# Patient Record
Sex: Male | Born: 1945 | Race: White | Hispanic: No | Marital: Single | Smoking: Current every day smoker
Health system: Southern US, Community
[De-identification: ages and names within clinical notes are randomized; demographics above are authoritative.]

## PROBLEM LIST (undated history)

## (undated) DIAGNOSIS — R519 Headache, unspecified: Secondary | ICD-10-CM

## (undated) DIAGNOSIS — R591 Generalized enlarged lymph nodes: Secondary | ICD-10-CM

## (undated) DIAGNOSIS — G629 Polyneuropathy, unspecified: Secondary | ICD-10-CM

## (undated) DIAGNOSIS — K5903 Drug induced constipation: Secondary | ICD-10-CM

## (undated) DIAGNOSIS — IMO0002 Reserved for concepts with insufficient information to code with codable children: Secondary | ICD-10-CM

## (undated) DIAGNOSIS — D72829 Elevated white blood cell count, unspecified: Secondary | ICD-10-CM

## (undated) DIAGNOSIS — C9110 Chronic lymphocytic leukemia of B-cell type not having achieved remission: Secondary | ICD-10-CM

## (undated) DIAGNOSIS — R51 Headache: Secondary | ICD-10-CM

## (undated) DIAGNOSIS — J189 Pneumonia, unspecified organism: Secondary | ICD-10-CM

## (undated) HISTORY — DX: Reserved for concepts with insufficient information to code with codable children: IMO0002

## (undated) HISTORY — DX: Polyneuropathy, unspecified: G62.9

## (undated) HISTORY — DX: Generalized enlarged lymph nodes: R59.1

## (undated) HISTORY — DX: Chronic lymphocytic leukemia of B-cell type not having achieved remission: C91.10

## (undated) HISTORY — PX: APPENDECTOMY: SHX54

## (undated) HISTORY — PX: SQUAMOUS CELL CARCINOMA EXCISION: SHX2433

## (undated) HISTORY — PX: TONSILLECTOMY: SUR1361

## (undated) HISTORY — DX: Elevated white blood cell count, unspecified: D72.829

## (undated) HISTORY — PX: LYMPH NODE BIOPSY: SHX201

---

## 2011-09-28 ENCOUNTER — Other Ambulatory Visit: Payer: Self-pay | Admitting: Oncology

## 2011-09-28 ENCOUNTER — Other Ambulatory Visit (HOSPITAL_COMMUNITY)
Admission: RE | Admit: 2011-09-28 | Discharge: 2011-09-28 | Disposition: A | Discharge status: Home / Self Care | Payer: Self-pay | Source: Ambulatory Visit | Attending: Oncology | Admitting: Oncology

## 2011-09-28 DIAGNOSIS — D72829 Elevated white blood cell count, unspecified: Secondary | ICD-10-CM | POA: Insufficient documentation

## 2013-11-13 DIAGNOSIS — J189 Pneumonia, unspecified organism: Secondary | ICD-10-CM

## 2013-11-13 HISTORY — DX: Pneumonia, unspecified organism: J18.9

## 2014-08-14 ENCOUNTER — Other Ambulatory Visit (HOSPITAL_COMMUNITY)
Admission: RE | Admit: 2014-08-14 | Discharge: 2014-08-14 | Disposition: A | Discharge status: Home / Self Care | Payer: Medicare Other | Source: Ambulatory Visit | Attending: Oncology | Admitting: Oncology

## 2014-09-09 ENCOUNTER — Other Ambulatory Visit (HOSPITAL_COMMUNITY)
Admission: RE | Admit: 2014-09-09 | Discharge: 2014-09-09 | Disposition: A | Discharge status: Home / Self Care | Payer: Medicare Other | Source: Ambulatory Visit | Attending: Oncology | Admitting: Oncology

## 2014-09-09 DIAGNOSIS — C9110 Chronic lymphocytic leukemia of B-cell type not having achieved remission: Secondary | ICD-10-CM | POA: Diagnosis present

## 2014-09-18 LAB — TISSUE HYBRIDIZATION TO NCBH

## 2014-12-07 ENCOUNTER — Other Ambulatory Visit (HOSPITAL_COMMUNITY): Payer: Self-pay | Admitting: Oncology

## 2014-12-07 DIAGNOSIS — C9110 Chronic lymphocytic leukemia of B-cell type not having achieved remission: Secondary | ICD-10-CM

## 2014-12-07 DIAGNOSIS — G629 Polyneuropathy, unspecified: Secondary | ICD-10-CM

## 2014-12-07 DIAGNOSIS — R599 Enlarged lymph nodes, unspecified: Secondary | ICD-10-CM

## 2014-12-07 DIAGNOSIS — R591 Generalized enlarged lymph nodes: Secondary | ICD-10-CM

## 2014-12-07 DIAGNOSIS — M79601 Pain in right arm: Secondary | ICD-10-CM

## 2014-12-07 DIAGNOSIS — M79621 Pain in right upper arm: Secondary | ICD-10-CM

## 2014-12-17 ENCOUNTER — Telehealth: Payer: Self-pay | Admitting: Neurology

## 2014-12-17 NOTE — Telephone Encounter (Signed)
I moved patient appt off of wait list from 01-14-15 to 12-21-14 notified referring Dr office

## 2014-12-18 ENCOUNTER — Encounter: Payer: Self-pay | Admitting: *Deleted

## 2014-12-21 ENCOUNTER — Encounter: Payer: Self-pay | Admitting: Neurology

## 2014-12-21 ENCOUNTER — Ambulatory Visit (INDEPENDENT_AMBULATORY_CARE_PROVIDER_SITE_OTHER): Payer: Medicare Other | Admitting: Neurology

## 2014-12-21 VITALS — BP 130/70 | HR 96 | Ht 67.0 in | Wt 158.4 lb

## 2014-12-21 DIAGNOSIS — IMO0001 Reserved for inherently not codable concepts without codable children: Secondary | ICD-10-CM

## 2014-12-21 DIAGNOSIS — G8331 Monoplegia, unspecified affecting right dominant side: Secondary | ICD-10-CM

## 2014-12-21 DIAGNOSIS — R202 Paresthesia of skin: Secondary | ICD-10-CM

## 2014-12-21 DIAGNOSIS — R209 Unspecified disturbances of skin sensation: Secondary | ICD-10-CM

## 2014-12-21 DIAGNOSIS — Z720 Tobacco use: Secondary | ICD-10-CM

## 2014-12-21 DIAGNOSIS — F17200 Nicotine dependence, unspecified, uncomplicated: Secondary | ICD-10-CM

## 2014-12-21 DIAGNOSIS — M62541 Muscle wasting and atrophy, not elsewhere classified, right hand: Secondary | ICD-10-CM

## 2014-12-21 DIAGNOSIS — C9110 Chronic lymphocytic leukemia of B-cell type not having achieved remission: Secondary | ICD-10-CM

## 2014-12-21 MED ORDER — NORTRIPTYLINE HCL 10 MG PO CAPS: ORAL_CAPSULE | ORAL | Status: DC

## 2014-12-21 NOTE — Patient Instructions (Signed)
1.  Start nortriptyline 10mg  at bedtime for two weeks, then increase to 2 tablets at bedtime. 2.  I will call you once I have reviewed your MRIs

## 2014-12-21 NOTE — Progress Notes (Signed)
[facility] Clinic Note - Initial Visit   Date: 12/21/2014   Darryl Horton MRN: [id number] DOB: [birth year]   Dear Dr. Bobby Rumpf:   Thank you for your kind referral of Darryl Horton for consultation of right arm pain and paresthesias. Although his history is well known to you, please allow [place] to reiterate it for the purpose of our medical record. The patient was accompanied to the clinic by self.   History of Present Illness: Darryl Horton is a 69 y.o. right-handed Caucasian male with chronic lymphocytic leukemia (diagnosed ~2014 s/p right axillary radiation therapy in summer 2015) and current tobacco use (90+ pack year history) presenting for evaluation of right arm pain, paresthesias, and weakness.    Around 2014, he presented to the hospital after he had an episode of nonsensical speech while talking on the phone.  He thought he had a stroke, so went to the hospital where he was diagnosed with TIA. During that evaluation, he was noted to have elevated white count which ultimately led to the diagnosis of CLL. He underwent imaging which showed enlarged right axillary lymph nodes.  He was being treated at the [place].  About 89-month following the diagnosis of CLL around late Spring 2015, he started developing right armpit discomfort and pain.  He was initially seen by someone filling in for his oncologist who examined his armpit, but did not palpate a mass so continued to observe it.  Over the next few months, his right armpit pain started to become sharp and more intense so he transitioned his care to Dr. Bobby Rumpf in [place].  He ordered needle biopsy of the biceps and axilla which confirmed CLL of his right axillary lymph nodes.  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  He underwent 14 cycles of radiation to his right axilla during the summer of 2015.     Since the summer, he  continued to have slowly progressive paresthesias, pain, and weakness of his right forearm and hand.  He was given narcotics for his pain which eventually alleviated the pain, but he has constant numbness/tingling and weakness of his hand that is unchanged.  He started taking ibruvica in December 2015 and reports having worsening of numbness and tingling.  Numbness involves his right eblow, forearm, and hand.  It is worse with any movement of the arm.  Regarding his weakness, he is unable to to lift anything with his right hand or perform any fine finger movements.  He has difficulty bathing and dressing himself.  He tried doing OT but says that any movement makes the numbness worse, so stopped it.   There is no weakness or paresthesias of left hand or bilateral legs.  He denies any neck pain.  His mood is understandably down and he has no interested.  Denies any suicidal ideation.  He has previously been evaluated by Dr. Metta Clines in Neurology in [place] who performed EMG on his right arm in Jul 2015 which showed right medial antebrachial cutaneous neuropathy.  He was taking neurontin 900mg  TID without any benefit.   He is scheduled to have an MRI cervical and right brachial plexus later this week.   Past Medical History  Diagnosis Date  . Squamous cell carcinoma   . Leukocytosis   . Chronic lymphocytic leukemia   . Neuropathy   . Lymphadenopathy     Past Surgical History  Procedure Laterality Date  .  Squamous cell carcinoma excision    . Lymph node biopsy       Medications:  Ibrutinib 420mg  Gabapentin 900mg  three times daily Vitamin B complex Vitamin C supplement   Allergies: No Known Allergies  Family History: Family History  Problem Relation Age of Onset  . Other Father 45    Deceased, rare blood disorder  . Pancreatic cancer Mother 55    Deceased  . Other Brother     Deceased  . Healthy Son   . Healthy Daughter     Social History: History   Social History  .  Marital Status: Single    Spouse Name: N/A    Number of Children: N/A  . Years of Education: N/A   Occupational History  . Not on file.   Social History Main Topics  . Smoking status: Current Every Day Smoker -- 2.00 packs/day for 45 years    Types: Cigarettes  . Smokeless tobacco: Not on file  . Alcohol Use: No  . Drug Use: No  . Sexual Activity: Not on file   Other Topics Concern  . Not on file   Social History Narrative   Lives with wife.   He runs a Control and instrumentation engineer.   Highest level of education:  B.S.       Review of Systems:  CONSTITUTIONAL: No fevers, chills, night sweats, +40lb weight loss.   EYES: No visual changes or eye pain ENT: No hearing changes.  No history of nose bleeds.   RESPIRATORY: No cough, wheezing and shortness of breath.   CARDIOVASCULAR: Negative for chest pain, and palpitations.   GI: Negative for abdominal discomfort, blood in stools or black stools.  No recent change in bowel habits.   GU:  No history of incontinence.   MUSCLOSKELETAL: No history of joint pain or swelling.  No myalgias.   SKIN: Negative for lesions, rash, and itching.   HEMATOLOGY/ONCOLOGY: Negative for prolonged bleeding, bruising easily, and swollen nodes.  No history of cancer.   ENDOCRINE: Negative for cold or heat intolerance, polydipsia or goiter.   PSYCH:  +depression or anxiety symptoms.   NEURO: As Above.   Vital Signs:  BP 130/70 mmHg  Pulse 96  Ht 5\' 7"  (1.702 m)  Wt 158 lb 7 oz (71.867 kg)  BMI 24.81 kg/m2  SpO2 93%   General Medical Exam:   General:  Well appearing, uncomfortable.   Eyes/ENT: see cranial nerve examination.   Neck: No masses appreciated.  Full range of motion without tenderness.  No carotid bruits. Respiratory:  Clear to auscultation, good air entry bilaterally.   Cardiac:  Regular rate and rhythm, no murmur.   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  Neurological Exam: MENTAL STATUS including orientation to  time, place, person, recent and remote memory, attention span and concentration, language, and fund of knowledge is normal.  Speech is not dysarthric.  CRANIAL NERVES: II:  No visual field defects.  Unremarkable fundi.   III-IV-VI: Pupils equal round and reactive to light.  Normal conjugate, extra-ocular eye movements in all directions of gaze.  No nystagmus.  No ptosis.   V:  Normal facial sensation.  Jaw jerk is absent.   VII:  Normal facial symmetry and movements.  Snout and Myerson's signs are absent.  Bilateral palmomental reflex. VIII:  Normal hearing and vestibular function.   IX-X:  Normal palatal movement.   XI:  Normal shoulder shrug and head rotation.   XII:  Normal tongue strength and range of motion, no deviation or fasciculation.  MOTOR:  Moderate atrophy of the forearm and hands, especially the medial forearm (pronator teres, intrinsic hand muscles).  Generalized loss of muscle bulk throughout.  No pronator drift.    Right Upper Extremity:    Left Upper Extremity:    Deltoid  5/5   Deltoid  5/5   Biceps  3/5   Biceps  5/5   Triceps  5/5   Triceps  5/5   Pronator teres 3/5  Pronator teres 5/5  Infraspinatus 4+/5  Infraspinatus 5/5  Medial pectoralis 5-/5  Medial pectoralis 5/5  Wrist extensors  5-/5   Wrist extensors  5/5   Wrist flexors  3/5   Wrist flexors  5/5   Finger extensors  4/5   Finger extensors  5/5   Finger flexors  4/5   Finger flexors  5/5   Dorsal interossei  4/5   Dorsal interossei  5/5   Abductor pollicis  4/5   Abductor pollicis  5/5   Tone (Ashworth scale)  0  Tone (Ashworth scale)  0   Right Lower Extremity:    Left Lower Extremity:    Hip flexors  5/5   Hip flexors  5/5   Hip extensors  5/5   Hip extensors  5/5   Knee flexors  5/5   Knee flexors  5/5   Knee extensors  5/5   Knee extensors  5/5   Dorsiflexors  5/5   Dorsiflexors  5/5   Plantarflexors  5/5   Plantarflexors  5/5   Toe extensors  5/5   Toe extensors  5/5   Toe flexors  5/5   Toe  flexors  5/5   Tone (Ashworth scale)  0  Tone (Ashworth scale)  0   MSRs:  Right                                                                 Left brachioradialis 3+  brachioradialis 3+  biceps 2+  biceps 3+  triceps 3+  triceps 3+  patellar 3+  patellar 3+  ankle jerk 3+  ankle jerk 3+  Hoffman no  Hoffman no  plantar response up  plantar response down   SENSORY: Reduced pin prick over the right forearm and hand following a circumferential pattern, most notably in the medial forearm. Sensation of the right upper arm appears to be intact. Vibration is reduced at the great toe bilaterally.  Romberg's sign absent.   COORDINATION/GAIT: Normal finger-to- nose-finger.  Intact rapid alternating movements on the left and there is imprecision on the right due to weakness.  Able to rise from a chair without using arms.  Gait narrow based and stable. Tandem and stressed gait intact.    IMPRESSION/PLAN: Darryl Horton is a 69 year-old gentleman with chronic lymphocytic leukemia s/p radiation therapy to right axilla in 2015  presenting for evaluation of progressively worsening right arm weakness, pain, and paresthesias. His neurological examination shows muscle atrophy and weakness involving C5-C8 myotomes with sensory loss involving the entire forearm and hand. Additionally, there is generalized hyperreflexia throughout and an extensor plantar response on the right. Possibilities include right brachial plexopathy (localized infiltration from malignancy versus radiation-induced) or multilevel intraspinal canal lesion affecting  the spinal cord at the cervical level.  With his history of radiation to the axilla, radiation-induced plexopathy needs to be considered, however pain is generally not as severe. I agree with imaging both the right brachial plexus and cervical spine which is scheduled for later this week. If his imaging does not show encroachment of the brachial plexus or cervical nerve roots due to  localized invasion from his lymphadenopathy, the next step will be to obtain EMG to look for myokymia discharges which would support the diagnosis of radiation-induced plexopathy.   From a symptomatic standpoint, he is mostly debilitated by his hand weakness and numbness. I explained that medications for nerve-related pain are more effective for painful paresthesias and less effective for numbness. He has previously tried gabapentin 900 mg 3 times daily without any benefit. I can try him on nortriptyline 10mg  x2 weeks, then increase to 2 tab at bedtime.  Common side effects and precautions were discussed, including suicidal ideation.  We also discussed occupational therapy for his hand weakness however he would like to hold off for now until workup is complete.   Tobacco cessation was discussed, but the patient is not interested in quitting as this is his only enjoyable activity.  The duration of this appointment visit was 60 minutes of face-to-face time with the patient.  Greater than 50% of this time was spent in counseling, explanation of diagnosis, planning of further management, and coordination of care.   Thank you for allowing me to participate in patient's care.  If I can answer any additional questions, I would be pleased to do so.    Sincerely,    Avinash Maltos K. Posey Pronto, DO

## 2014-12-22 NOTE — Progress Notes (Signed)
Note faxed.

## 2014-12-23 ENCOUNTER — Encounter (HOSPITAL_COMMUNITY): Payer: Self-pay | Admitting: *Deleted

## 2014-12-24 ENCOUNTER — Ambulatory Visit (HOSPITAL_COMMUNITY): Payer: Medicare Other | Admitting: Anesthesiology

## 2014-12-24 ENCOUNTER — Ambulatory Visit (HOSPITAL_COMMUNITY)
Admission: RE | Admit: 2014-12-24 | Discharge: 2014-12-24 | Disposition: A | Discharge status: Home / Self Care | Payer: Medicare Other | Source: Ambulatory Visit | Attending: Oncology | Admitting: Oncology

## 2014-12-24 ENCOUNTER — Encounter (HOSPITAL_COMMUNITY)
Admission: RE | Disposition: A | Discharge status: Home / Self Care | Payer: Self-pay | Source: Ambulatory Visit | Attending: Oncology

## 2014-12-24 ENCOUNTER — Encounter (HOSPITAL_COMMUNITY): Payer: Self-pay | Admitting: Anesthesiology

## 2014-12-24 DIAGNOSIS — F17210 Nicotine dependence, cigarettes, uncomplicated: Secondary | ICD-10-CM | POA: Insufficient documentation

## 2014-12-24 DIAGNOSIS — C9110 Chronic lymphocytic leukemia of B-cell type not having achieved remission: Secondary | ICD-10-CM

## 2014-12-24 DIAGNOSIS — R591 Generalized enlarged lymph nodes: Secondary | ICD-10-CM

## 2014-12-24 DIAGNOSIS — R599 Enlarged lymph nodes, unspecified: Secondary | ICD-10-CM

## 2014-12-24 DIAGNOSIS — M79621 Pain in right upper arm: Secondary | ICD-10-CM

## 2014-12-24 DIAGNOSIS — G629 Polyneuropathy, unspecified: Secondary | ICD-10-CM

## 2014-12-24 HISTORY — DX: Pneumonia, unspecified organism: J18.9

## 2014-12-24 HISTORY — DX: Headache: R51

## 2014-12-24 HISTORY — DX: Headache, unspecified: R51.9

## 2014-12-24 HISTORY — PX: RADIOLOGY WITH ANESTHESIA: SHX6223

## 2014-12-24 LAB — CBC
HEMATOCRIT: 47.4 % (ref 39.0–52.0)
HEMOGLOBIN: 15.6 g/dL (ref 13.0–17.0)
MCH: 30.2 pg (ref 26.0–34.0)
MCHC: 32.9 g/dL (ref 30.0–36.0)
MCV: 91.7 fL (ref 78.0–100.0)
Platelets: 316 10*3/uL (ref 150–400)
RBC: 5.17 MIL/uL (ref 4.22–5.81)
RDW: 16.4 % — AB (ref 11.5–15.5)
WBC: 196.8 10*3/uL (ref 4.0–10.5)

## 2014-12-24 LAB — BASIC METABOLIC PANEL
Anion gap: 5 (ref 5–15)
BUN: 10 mg/dL (ref 6–23)
CALCIUM: 9.5 mg/dL (ref 8.4–10.5)
CO2: 29 mmol/L (ref 19–32)
Chloride: 99 mmol/L (ref 96–112)
Creatinine, Ser: 0.89 mg/dL (ref 0.50–1.35)
GFR calc Af Amer: >90 mL/min (ref >90)
GFR calc non Af Amer: 87 mL/min — ABNORMAL LOW (ref >90)
GLUCOSE: 85 mg/dL (ref 70–99)
POTASSIUM: 5.1 mmol/L (ref 3.5–5.1)
Sodium: 133 mmol/L — ABNORMAL LOW (ref 135–145)

## 2014-12-24 SURGERY — RADIOLOGY WITH ANESTHESIA
Anesthesia: Monitor Anesthesia Care

## 2014-12-24 MED ORDER — OXYCODONE HCL 5 MG PO TABS: 5.0000 mg | ORAL_TABLET | Freq: Once | ORAL | Status: DC | PRN

## 2014-12-24 MED ORDER — ONDANSETRON HCL 4 MG/2ML IJ SOLN
4.0000 mg | Freq: Four times a day (QID) | INTRAMUSCULAR | Status: DC | PRN
Start: 2014-12-24 — End: 2014-12-24

## 2014-12-24 MED ORDER — SODIUM CHLORIDE 0.9 % IV SOLN
INTRAVENOUS | Status: DC | PRN
  Administered 2014-12-24: 10:00:00 via INTRAVENOUS

## 2014-12-24 MED ORDER — OXYCODONE HCL 5 MG/5ML PO SOLN: 5.0000 mg | Freq: Once | ORAL | Status: DC | PRN

## 2014-12-24 MED ORDER — LACTATED RINGERS IV SOLN
INTRAVENOUS | Status: DC | PRN
  Administered 2014-12-24: 07:00:00 via INTRAVENOUS

## 2014-12-24 MED ORDER — LACTATED RINGERS IV SOLN
INTRAVENOUS | Status: DC
  Administered 2014-12-24: 07:00:00 via INTRAVENOUS

## 2014-12-24 MED ORDER — FENTANYL CITRATE 0.05 MG/ML IJ SOLN: 25.0000 ug | INTRAMUSCULAR | Status: DC | PRN

## 2014-12-24 NOTE — Discharge Instructions (Signed)
°What to eat: ° °For your first meals, you should eat lightly; only small meals initially.  If you do not have nausea, you may eat larger meals.  Avoid spicy, greasy and heavy food.   ° °General Anesthesia, Adult, Care After  °Refer to this sheet in the next few weeks. These instructions provide you with information on caring for yourself after your procedure. Your health care provider may also give you more specific instructions. Your treatment has been planned according to current medical practices, but problems sometimes occur. Call your health care provider if you have any problems or questions after your procedure.  °WHAT TO EXPECT AFTER THE PROCEDURE  °After the procedure, it is typical to experience:  °Sleepiness.  °Nausea and vomiting. °HOME CARE INSTRUCTIONS  °For the first 24 hours after general anesthesia:  °Have a responsible person with you.  °Do not drive a car. If you are alone, do not take public transportation.  °Do not drink alcohol.  °Do not take medicine that has not been prescribed by your health care provider.  °Do not sign important papers or make important decisions.  °You may resume a normal diet and activities as directed by your health care provider.  °Change bandages (dressings) as directed.  °If you have questions or problems that seem related to general anesthesia, call the hospital and ask for the anesthetist or anesthesiologist on call. °SEEK MEDICAL CARE IF:  °You have nausea and vomiting that continue the day after anesthesia.  °You develop a rash. °SEEK IMMEDIATE MEDICAL CARE IF:  °You have difficulty breathing.  °You have chest pain.  °You have any allergic problems. °Document Released: 02/05/2001 Document Revised: 07/02/2013 Document Reviewed: 05/15/2013  °ExitCare® Patient Information ©2014 [employer].  ° °Sore Throat  ° ° °A sore throat is a painful, burning, sore, or scratchy feeling of the throat. There may be pain or tenderness when swallowing or talking. You may have  other symptoms with a sore throat. These include coughing, sneezing, fever, or a swollen neck. A sore throat is often the first sign of another sickness. These sicknesses may include a cold, flu, strep throat, or an infection called mono. Most sore throats go away without medical treatment.  °HOME CARE  °Only take medicine as told by your doctor.  °Drink enough fluids to keep your pee (urine) clear or pale yellow.  °Rest as needed.  °Try using throat sprays, lozenges, or suck on hard candy (if older than 4 years or as told).  °Sip warm liquids, such as broth, herbal tea, or warm water with honey. Try sucking on frozen ice pops or drinking cold liquids.  °Rinse the mouth (gargle) with salt water. Mix 1 teaspoon salt with 8 ounces of water.  °Do not smoke. Avoid being around others when they are smoking.  °Put a humidifier in your bedroom at night to moisten the air. You can also turn on a hot shower and sit in the bathroom for 5-10 minutes. Be sure the bathroom door is closed. °GET HELP RIGHT AWAY IF:  °You have trouble breathing.  °You cannot swallow fluids, soft foods, or your spit (saliva).  °You have more puffiness (swelling) in the throat.  °Your sore throat does not get better in 7 days.  °You feel sick to your stomach (nauseous) and throw up (vomit).  °You have a fever or lasting symptoms for more than 2-3 days.  °You have a fever and your symptoms suddenly get worse. °MAKE SURE YOU:  °Understand these   instructions.  °Will watch your condition.  °Will get help right away if you are not doing well or get worse. °Document Released: 08/08/2008 Document Revised: 07/24/2012 Document Reviewed: 07/07/2012  °ExitCare® Patient Information ©2015 [employer]. This information is not intended to replace advice given to you by your health care provider. Make sure you discuss any questions you have with your health care provider.  ° ° ° °

## 2014-12-24 NOTE — Anesthesia Postprocedure Evaluation (Signed)
  Anesthesia Post-op Note  Patient: Darryl Horton  Procedure(s) Performed: Procedure(s) with comments: MRI CERVICAL SPINE WITH/WITHOUT CONTRAST/MRI CHEST WITH/WITHOUT CONTRAST (N/A) - MRI/DR. DEQUINCY A. LEWIS  Patient Location: PACU  Anesthesia Type:General  Level of Consciousness: awake, alert , oriented and patient cooperative  Airway and Oxygen Therapy: Patient Spontanous Breathing and Patient connected to nasal cannula oxygen  Post-op Pain: none  Post-op Assessment: Post-op Vital signs reviewed  Post-op Vital Signs: Reviewed and stable  Last Vitals:  Filed Vitals:   12/24/14 1111  BP:   Pulse: 87  Temp:   Resp: 16    Complications: No apparent anesthesia complications

## 2014-12-24 NOTE — Anesthesia Preprocedure Evaluation (Signed)
Anesthesia Evaluation  Patient identified by MRN, date of birth, ID band Patient awake    Reviewed: Allergy & Precautions, NPO status , Patient's Chart, lab work & pertinent test results  Airway Mallampati: II   Neck ROM: full    Dental   Pulmonary Current Smoker,          Cardiovascular negative cardio ROS      Neuro/Psych  Headaches,    GI/Hepatic   Endo/Other    Renal/GU      Musculoskeletal   Abdominal   Peds  Hematology   Anesthesia Other Findings   Reproductive/Obstetrics                             Anesthesia Physical Anesthesia Plan  ASA: II  Anesthesia Plan: MAC   Post-op Pain Management:    Induction: Intravenous  Airway Management Planned: Simple Face Mask  Additional Equipment:   Intra-op Plan:   Post-operative Plan:   Informed Consent: I have reviewed the patients History and Physical, chart, labs and discussed the procedure including the risks, benefits and alternatives for the proposed anesthesia with the patient or authorized representative who has indicated his/her understanding and acceptance.     Plan Discussed with: CRNA, Anesthesiologist and Surgeon  Anesthesia Plan Comments:         Anesthesia Quick Evaluation

## 2014-12-24 NOTE — Transfer of Care (Signed)
Immediate Anesthesia Transfer of Care Note  Patient: Darryl Horton  Procedure(s) Performed: Procedure(s) with comments: MRI CERVICAL SPINE WITH/WITHOUT CONTRAST/MRI CHEST WITH/WITHOUT CONTRAST (N/A) - MRI/DR. DEQUINCY A. LEWIS  Patient Location: PACU  Anesthesia Type:General  Level of Consciousness: awake, alert , oriented and patient cooperative  Airway & Oxygen Therapy: Patient Spontanous Breathing and Patient connected to nasal cannula oxygen  Post-op Assessment: Report given to RN, Post -op Vital signs reviewed and stable and Patient moving all extremities  Post vital signs: Reviewed and stable  Last Vitals:  Filed Vitals:   12/24/14 1111  BP:   Pulse: 87  Temp:   Resp: 16    Complications: No apparent anesthesia complications

## 2014-12-24 NOTE — Progress Notes (Signed)
Dr. Albertha Ghee notified of patient's WBC 196.8.  He repeated it back and stated they will look into it.

## 2014-12-25 ENCOUNTER — Encounter (HOSPITAL_COMMUNITY): Payer: Self-pay | Admitting: Radiology

## 2014-12-30 ENCOUNTER — Telehealth: Payer: Self-pay | Admitting: Neurology

## 2014-12-30 NOTE — Telephone Encounter (Signed)
Pt called stating that he is returning your call from today. C/b [phone]

## 2014-12-30 NOTE — Telephone Encounter (Signed)
I attempted to contact patient via phone today regarding the results of MRI cervical spine and brachial plexus, however there was no answer so a message was left for the patient to return my call.   Donika K. Posey Pronto, DO

## 2015-01-01 NOTE — Telephone Encounter (Signed)
Returned call to patient. Patient saw his oncologist yesterday who was able to schedule EMG for next week.  Imaging is suggestive of a right axillary mass causing localized compression of the neurovascular structures, including distal brachial plexus cord.  Biopsy of this lesion would be definitive, if possible.  Donika K. Posey Pronto, DO

## 2015-01-14 ENCOUNTER — Ambulatory Visit: Payer: Medicare Other | Admitting: Neurology

## 2015-04-18 ENCOUNTER — Emergency Department (HOSPITAL_COMMUNITY)
Admission: EM | Admit: 2015-04-18 | Discharge: 2015-04-19 | Disposition: A | Discharge status: Home / Self Care | Payer: Medicare Other | Attending: Emergency Medicine | Admitting: Emergency Medicine

## 2015-04-18 ENCOUNTER — Encounter (HOSPITAL_COMMUNITY): Payer: Self-pay | Admitting: Emergency Medicine

## 2015-04-18 DIAGNOSIS — Z862 Personal history of diseases of the blood and blood-forming organs and certain disorders involving the immune mechanism: Secondary | ICD-10-CM | POA: Diagnosis not present

## 2015-04-18 DIAGNOSIS — Z8669 Personal history of other diseases of the nervous system and sense organs: Secondary | ICD-10-CM | POA: Insufficient documentation

## 2015-04-18 DIAGNOSIS — Z856 Personal history of leukemia: Secondary | ICD-10-CM | POA: Insufficient documentation

## 2015-04-18 DIAGNOSIS — Z85828 Personal history of other malignant neoplasm of skin: Secondary | ICD-10-CM | POA: Insufficient documentation

## 2015-04-18 DIAGNOSIS — Z79899 Other long term (current) drug therapy: Secondary | ICD-10-CM | POA: Insufficient documentation

## 2015-04-18 DIAGNOSIS — Z8701 Personal history of pneumonia (recurrent): Secondary | ICD-10-CM | POA: Diagnosis not present

## 2015-04-18 DIAGNOSIS — I890 Lymphedema, not elsewhere classified: Secondary | ICD-10-CM | POA: Insufficient documentation

## 2015-04-18 DIAGNOSIS — R590 Localized enlarged lymph nodes: Secondary | ICD-10-CM | POA: Diagnosis present

## 2015-04-18 DIAGNOSIS — Z720 Tobacco use: Secondary | ICD-10-CM | POA: Diagnosis not present

## 2015-04-18 LAB — I-STAT CHEM 8, ED
BUN: 10 mg/dL (ref 6–20)
CALCIUM ION: 1.14 mmol/L (ref 1.13–1.30)
Chloride: 101 mmol/L (ref 101–111)
Creatinine, Ser: 0.8 mg/dL (ref 0.61–1.24)
Glucose, Bld: 133 mg/dL — ABNORMAL HIGH (ref 65–99)
HCT: 41 % (ref 39.0–52.0)
Hemoglobin: 13.9 g/dL (ref 13.0–17.0)
Potassium: 3.6 mmol/L (ref 3.5–5.1)
Sodium: 136 mmol/L (ref 135–145)
TCO2: 21 mmol/L (ref 0–100)

## 2015-04-18 NOTE — ED Notes (Signed)
Patient with swelling and pain in right arm.  Patient states that he went to [place], was not able to get to the door when the pharmacy came to deliver his Lyrica.  He has been diagnosed with lymphedema in that arm and the pain is unbearable at this time.  Arm is red, swollen, not hot.  Patient states that it has gotten worse in the last day or two.

## 2015-04-19 ENCOUNTER — Telehealth: Payer: Self-pay | Admitting: Neurology

## 2015-04-19 ENCOUNTER — Emergency Department (HOSPITAL_COMMUNITY): Payer: Medicare Other

## 2015-04-19 DIAGNOSIS — I890 Lymphedema, not elsewhere classified: Secondary | ICD-10-CM | POA: Diagnosis not present

## 2015-04-19 LAB — CBC WITH DIFFERENTIAL/PLATELET
BASOS ABS: 0 10*3/uL (ref 0.0–0.1)
Basophils Relative: 0 % (ref 0–1)
EOS ABS: 0 10*3/uL (ref 0.0–0.7)
Eosinophils Relative: 0 % (ref 0–5)
HEMATOCRIT: 42.4 % (ref 39.0–52.0)
Hemoglobin: 13.7 g/dL (ref 13.0–17.0)
LYMPHS PCT: 85 % — AB (ref 12–46)
Lymphs Abs: 66.9 10*3/uL — ABNORMAL HIGH (ref 0.7–4.0)
MCH: 29.1 pg (ref 26.0–34.0)
MCHC: 32.3 g/dL (ref 30.0–36.0)
MCV: 90 fL (ref 78.0–100.0)
MONO ABS: 1.6 10*3/uL — AB (ref 0.1–1.0)
Monocytes Relative: 2 % — ABNORMAL LOW (ref 3–12)
NEUTROS ABS: 10.2 10*3/uL — AB (ref 1.7–7.7)
Neutrophils Relative %: 13 % — ABNORMAL LOW (ref 43–77)
PLATELETS: 322 10*3/uL (ref 150–400)
RBC: 4.71 MIL/uL (ref 4.22–5.81)
RDW: 15.6 % — AB (ref 11.5–15.5)
Smear Review: ADEQUATE
WBC: 78.7 10*3/uL (ref 4.0–10.5)

## 2015-04-19 LAB — PATHOLOGIST SMEAR REVIEW

## 2015-04-19 MED ORDER — HYDROMORPHONE HCL 1 MG/ML IJ SOLN
1.0000 mg | INTRAMUSCULAR | Status: DC | PRN
  Administered 2015-04-19 (×2): 1 mg via INTRAVENOUS
  Filled 2015-04-19 (×2): qty 1

## 2015-04-19 MED ORDER — OXYCODONE-ACETAMINOPHEN 5-325 MG PO TABS: 2.0000 | ORAL_TABLET | Freq: Three times a day (TID) | ORAL | Status: DC | PRN

## 2015-04-19 NOTE — Discharge Instructions (Signed)
Lymphedema Darryl Horton, see your primary doctor tomorrow for close follow up.  Take percocet at home as prescribed for worsening pain.  If symptoms worsen, come back to the ED immediately.  Thank you. Lymphedema is a swelling caused by the abnormal collection of lymph under the skin. The lymph is fluid from the tissues in your body that travels in the lymphatic system. This system is part of the immune system that includes lymph nodes and vessels. The lymph vessels collect and carry the excess fluid, fats, proteins, and wastes from the tissues of the body to the bloodstream. This system also works to clean and remove bacteria and waste products from the body.  Lymphedema occurs when the lymphatic system is blocked. When the lymph vessels or lymph nodes are blocked or damaged, lymph does not drain properly. This causes abnormal build up of lymph. This leads to swelling in the arms or legs. Lymphedema cannot be cured by medicines. But the swelling can be reduced by physical methods. CAUSES  There are two types of lymphedema. Primary lymphedema is caused by the absence or abnormality of the lymph vessel at birth. It is also known as inherited lymphedema, which occurs rarely. Secondary or acquired lymphedema occurs when the lymph vessel is damaged or blocked. The causes of lymph vessel blockage are:   Skin infection like cellulites.  Infection by parasites (filariasis).  Injury.  Cancer.  Radiation therapy.  Formation of scar tissue.  Surgery. SYMPTOMS  The symptoms of lymphedema are:  Abnormal swelling of the arm or leg.  Heavy or tight feeling in your arm or leg.  Tight-fitting shoes or rings.  Redness of skin over the affected area.  Limited movement of the affected limb.  Some patients complain about sensitivity to touch and discomfort in the limb(s) affected. You may not have these symptoms immediately following injury. They usually appear within a few days or even years after  injury. Inform your caregiver, if you have any of these symptoms. Early treatment can avoid further problems.  DIAGNOSIS  First, your caregiver will inquire about any surgery you have had or medicines you are taking. He will then examine you. Your caregiver may order special imaging tests, such as:  Lymphoscintigraphy (a test in which a low dose of radioactive substance is injected to trace the flow of lymph through the lymph vessels).  MRI (imaging tests using magnetic fields).  Computed tomography (test using special cross-sectional X-rays).  Duplex ultrasound (test using high-frequency sound waves to show the vessels and the blood flow on a screen).  Lymphangiography (special X-ray taken after injecting a contrast dye into the lymph vessel). It is now rarely done. TREATMENT  Lymphedema can be treated in different ways. Your caregiver will decide the type of treatment depending on the cause. Treatment may include:  Exercise: Special exercises will help fluid move out easily from the affected part. This should be done as per your caregiver's advice.  Manual lymph drainage: Gentle massage of the affected limb makes the fluid to move out more freely.  Compression: Compression stockings or external pump apply pressure over the affected limb. This helps the fluid to move out from the arm or leg. Bandaging can also help to move the fluid out from the affected part. Your caregiver will decide the method that suits you the best.  Medicines: Your caregiver may prescribe antibiotics, if you have infection.  Surgery: Your caregiver may advise surgery for severe lymphedema. It is reserved for special cases when the  patient has difficulty moving. Your surgeon may remove excess tissue from the arm or leg. This will help to ease your movement. Physical therapy may have to be continued after surgery. HOME CARE INSTRUCTIONS  The area is very fragile and is predisposed to injury and infection.  Eat a  healthy diet.  Exercise regularly as per advice.  Keep the affected area clean and dry.  Use gloves while cooking or gardening.  Protect your skin from cuts.  Use electric razor to shave the affected area.  Keep affected limb elevated.  Do not wear tight clothes, shoes, or jewelry as it may cause the tissue to be strangled.  Do not use heat pads over the affected area.  Do not sit with cross legs.  Do not walk barefoot.  Do not carry weight on the affected arm.  Avoid having blood pressure checked on the affected limb. SEEK MEDICAL CARE IF:  You continue to have swelling in your limb. SEEK IMMEDIATE MEDICAL CARE IF:   You have high fever.  You have skin rash.  You have chills or sweats.  You have pain or redness.  You have a cut that does not heal. MAKE SURE YOU:   Understand these instructions.  Will watch your condition.  Will get help right away if you are not doing well or get worse. Document Released: 08/27/2007 Document Revised: 10/16/2012 Document Reviewed: 08/02/2009 [facility] Patient Information 2015 [place], [place]. This information is not intended to replace advice given to you by your health care provider. Make sure you discuss any questions you have with your health care provider.

## 2015-04-19 NOTE — Telephone Encounter (Signed)
Pt wife Carla Drape called and wants to talk to someone about the pain her husband is in please call [phone]

## 2015-04-19 NOTE — ED Notes (Addendum)
Pt arrives with ongoing pain with lymphadenopathy pain since yesterday, states he is supposed to start lyrica but hasn't gotten the RX yet - wasn't able to get to the door when delivered. Usually takes morphine for pain - is out of meds. States seen by oncologist on Wednesday, who wanted him to get an XRAY of R arm in ED - did not go until today.   Pt also has bilateral ankle swelling 1+ edema. First noticed today.

## 2015-04-19 NOTE — ED Provider Notes (Signed)
CSN: [id number]     Arrival date & time 04/18/15  2220 History   None    This chart was scribed for Everlene Balls, MD by Forrestine Him, ED Scribe. This patient was seen in room A08C/A08C and the patient's care was started 12:46 AM.   Chief Complaint  Patient presents with  . Lymphadenopathy   The history is provided by the patient and the spouse. No language interpreter was used.    HPI Comments: Darryl Horton is a 69 y.o. male with a PMHx of lymphadenopathy and squamous cell carcinoma who presents to the Emergency Department complaining of constant, ongoing, progressively worsening R arm pain x 1 day. No aggravating or alleviating factors at this time. Darryl Horton was seen by his Oncologist on 6/1 who is requesting an X-Ray of the R arm. Wife reports previous history of blood clots to the R arm currently being treated with Lovenox. At this time, pt also reports bilateral ankle swelling onset today. Typically pt takes Morphine daily for pain but is out of this medication. Pt is due to start Lyrica but has not received his prescription yet. Prescription arrived for delivery a few days ago, however, pt was unable to get to the door in time. No recent fever, chills, shortness or breath, chest pain, nausea, or vomiting. No known allergies to medications.  Past Medical History  Diagnosis Date  . Squamous cell carcinoma   . Leukocytosis   . Chronic lymphocytic leukemia   . Neuropathy   . Lymphadenopathy   . Pneumonia   . Headache     cluster headaches years ago   Past Surgical History  Procedure Laterality Date  . Squamous cell carcinoma excision    . Lymph node biopsy    . Tonsillectomy    . Appendectomy    . Radiology with anesthesia N/A 12/24/2014    Procedure: MRI CERVICAL SPINE WITH/WITHOUT CONTRAST/MRI CHEST WITH/WITHOUT CONTRAST;  Surgeon: Medication Radiologist, MD;  Location: [facility];  Service: Radiology;  Laterality: N/A;  MRI/DR. DEQUINCY A. LEWIS   Family History   Problem Relation Age of Onset  . Other Father 63    Deceased, rare blood disorder  . Pancreatic cancer Mother 63    Deceased  . Other Brother     Deceased  . Healthy Son   . Healthy Daughter    History  Substance Use Topics  . Smoking status: Current Every Day Smoker -- 2.00 packs/day for 45 years    Types: Cigarettes  . Smokeless tobacco: Never Used  . Alcohol Use: 0.0 oz/week    0 Standard drinks or equivalent per week     Comment: very rare    Review of Systems  Constitutional: Negative for fever and chills.  Respiratory: Negative for cough and shortness of breath.   Cardiovascular: Positive for leg swelling.  Gastrointestinal: Negative for nausea and vomiting.  Musculoskeletal: Positive for joint swelling and arthralgias.  Skin: Negative for rash.  Neurological: Negative for weakness.  Psychiatric/Behavioral: Negative for confusion.  All other systems reviewed and are negative.     Allergies  Review of patient's allergies indicates no known allergies.  Home Medications   Prior to Admission medications   Medication Sig Start Date End Date Taking? Authorizing Provider  B Complex Vitamins (VITAMIN-B COMPLEX) TABS Take 2 tablets by mouth 2 (two) times daily.     Historical Provider, MD  ibrutinib (IMBRUVICA) 140 MG capsul Take 420 mg by mouth daily.    Historical Provider, MD  nortriptyline (PAMELOR) 10 MG capsule Take 1 tablet at bedtime x 2 weeks, then increase to 2 tablets Patient taking differently: Take 10 mg by mouth at bedtime. Take 1 tablet at bedtime x 2 weeks, then increase to 2 tablets 12/21/14   Donika K Patel, DO  temazepam (RESTORIL) 15 MG capsule Take 15 mg by mouth at bedtime as needed for sleep.    Historical Provider, MD   Triage Vitals: BP 127/62 mmHg  Pulse 71  Temp(Src) 98.2 F (36.8 C) (Oral)  Resp 18  Ht 5\' 7"  (1.702 m)  Wt 160 lb (72.576 kg)  BMI 25.05 kg/m2  SpO2 97%   Physical Exam  Constitutional: He is oriented to person, place,  and time. Vital signs are normal. He appears well-developed and well-nourished.  Non-toxic appearance. He does not appear ill. He appears distressed.  HENT:  Head: Normocephalic and atraumatic.  Nose: Nose normal.  Mouth/Throat: Oropharynx is clear and moist. No oropharyngeal exudate.  Eyes: Conjunctivae and EOM are normal. Pupils are equal, round, and reactive to light. No scleral icterus.  Neck: Normal range of motion. Neck supple. No tracheal deviation, no edema, no erythema and normal range of motion present. No thyroid mass and no thyromegaly present.  Cardiovascular: Normal rate, regular rhythm, S1 normal, S2 normal, normal heart sounds, intact distal pulses and normal pulses.  Exam reveals no gallop and no friction rub.   No murmur heard. Pulses:      Radial pulses are 2+ on the right side, and 2+ on the left side.       Dorsalis pedis pulses are 2+ on the right side, and 2+ on the left side.  Normal pulses noted  Pulmonary/Chest: Effort normal and breath sounds normal. No respiratory distress. He has no wheezes. He has no rhonchi. He has no rales.  Abdominal: Soft. Normal appearance and bowel sounds are normal. He exhibits no distension, no ascites and no mass. There is no hepatosplenomegaly. There is no tenderness. There is no rebound, no guarding and no CVA tenderness.  Musculoskeletal: Normal range of motion. He exhibits edema. He exhibits no tenderness.  R upper extemity lymphedema  2 plus bilateral lower extremity edema  Lymphadenopathy:    He has no cervical adenopathy.  Neurological: He is alert and oriented to person, place, and time. He has normal strength. No cranial nerve deficit or sensory deficit.  Sensation intact   Skin: Skin is warm, dry and intact. No petechiae and no rash noted. He is not diaphoretic. No erythema. No pallor.  Psychiatric: He has a normal mood and affect. His behavior is normal. Judgment normal.  Nursing note and vitals reviewed.   ED Course   Procedures (including critical care time)  DIAGNOSTIC STUDIES: Oxygen Saturation is 97% on RA, adequate by my interpretation.    COORDINATION OF CARE: 12:52 AM- Will order DG forearm R, CBC, and i-stat chem 8.  Will give Dilaudid. Discussed treatment plan with pt at bedside and pt agreed to plan.     Labs Review Labs Reviewed  CBC WITH DIFFERENTIAL/PLATELET - Abnormal; Notable for the following:    WBC 78.7 (*)    RDW 15.6 (*)    Neutrophils Relative % 13 (*)    Lymphocytes Relative 85 (*)    Monocytes Relative 2 (*)    Neutro Abs 10.2 (*)    Lymphs Abs 66.9 (*)    Monocytes Absolute 1.6 (*)    All other components within normal limits  I-STAT CHEM 8, ED -  Abnormal; Notable for the following:    Glucose, Bld 133 (*)    All other components within normal limits    Imaging Review Dg Forearm Right  04/19/2015   CLINICAL DATA:  Lymphadenopathy. Right forearm swelling, pain and redness.  EXAM: RIGHT FOREARM - 2 VIEW  COMPARISON:  None.  FINDINGS: There is diffusely decreased density involving the distal radius and ulna from the metaphysis through the articular surface. Diffusely decreased density of the carpal bones and proximal metacarpals. Proximal radius and ulna are intact. There is no bony destructive change. No acute fracture. Soft tissue edema of the upper extremities noted.  IMPRESSION: Decreased density of the carpal bones and distal radius and ulna. This is nonspecific, however can be seen in the setting of complex regional pain syndrome. Inflammatory arthropathy about the hand and wrist could have a similar appearance.  Diffuse soft tissue edema.   Electronically Signed   By: Jeb Levering M.D.   On: 04/19/2015 01:20     EKG Interpretation None      MDM   Final diagnoses:  None    Patient presents to the ED for worsening lypmhedema and pain in the RUE.  This has progressively gotten worse over the past 2 weeks.  He was going to be placed on lyrica but never got the  medication.  He has known history ofclots and is taking lovenox.  He was given dilaudid in the ED for severe pain.    He received dilaudid x 2 doses with good pain relief.  He states he believes he can go home now with this amount of pain control.  Will send home with percocet as he has no more morphine tablets to take at home.  Tomorrow he will follow up with his oncologist and also pick up his lyrica.  He was advised to take the lyrica as his pain sounds like nerve pain.  He describes burning and a shooting pain throughout his arm.     I personally performed the services described in this documentation, which was scribed in my presence. The recorded information has been reviewed and is accurate.   Everlene Balls, MD 04/19/15 [phone]

## 2015-04-20 NOTE — Telephone Encounter (Signed)
Called Dottie back and got VM.  Left message for her to call me back.

## 2015-04-20 NOTE — Telephone Encounter (Signed)
Please review

## 2015-06-14 ENCOUNTER — Other Ambulatory Visit (HOSPITAL_COMMUNITY): Payer: Self-pay | Admitting: Internal Medicine

## 2015-06-14 DIAGNOSIS — C9110 Chronic lymphocytic leukemia of B-cell type not having achieved remission: Secondary | ICD-10-CM

## 2015-06-16 ENCOUNTER — Encounter: Payer: Self-pay | Admitting: Radiation Oncology

## 2015-06-16 ENCOUNTER — Ambulatory Visit: Payer: Medicare Other

## 2015-06-16 ENCOUNTER — Ambulatory Visit: Admission: RE | Admit: 2015-06-16 | Payer: Medicare Other | Source: Ambulatory Visit | Admitting: Radiation Oncology

## 2015-06-16 ENCOUNTER — Ambulatory Visit
Admission: RE | Admit: 2015-06-16 | Discharge: 2015-06-16 | Disposition: A | Discharge status: Home / Self Care | Payer: Medicare Other | Source: Ambulatory Visit | Attending: Radiation Oncology | Admitting: Radiation Oncology

## 2015-06-16 VITALS — BP 108/64 | HR 74 | Temp 98.1°F | Resp 16 | Ht 67.0 in | Wt 170.0 lb

## 2015-06-16 DIAGNOSIS — Z85828 Personal history of other malignant neoplasm of skin: Secondary | ICD-10-CM | POA: Diagnosis not present

## 2015-06-16 DIAGNOSIS — Z808 Family history of malignant neoplasm of other organs or systems: Secondary | ICD-10-CM | POA: Insufficient documentation

## 2015-06-16 DIAGNOSIS — C9110 Chronic lymphocytic leukemia of B-cell type not having achieved remission: Secondary | ICD-10-CM

## 2015-06-16 DIAGNOSIS — Z510 Encounter for antineoplastic radiation therapy: Secondary | ICD-10-CM | POA: Insufficient documentation

## 2015-06-16 DIAGNOSIS — C9210 Chronic myeloid leukemia, BCR/ABL-positive, not having achieved remission: Secondary | ICD-10-CM

## 2015-06-16 NOTE — Progress Notes (Signed)
See progress note under physician encounter. 

## 2015-06-16 NOTE — Progress Notes (Signed)
Radiation Oncology         [phone] ________________________________  Outpatient Re-Consultation (patient seen recently in our practice by Dr Orlene Erm)  Name: Darryl Horton MRN: [id number]  Date: 06/16/2015  DOB: Mar 30, 1946  YT:KPTW,SFKCLE F, NP  Imagene Riches, NP   REFERRING PHYSICIAN: Imagene Riches, NP  DIAGNOSIS:    ICD-9-CM ICD-10-CM   1. CLL (chronic lymphocytic leukemia) 204.10 C91.10     HISTORY OF PRESENT ILLNESS::Darryl Horton is a 69 y.o. male who was diagnosed with chronic lymphocytic leukemia in November 2012. He presented with elevated WBCs and a painful lump in his right axilla. He completed radiotherapy at [facility] on 05/08/14 with 25.2 Gy at 1.8 Gy/fraction by Dr. Orlene Erm to the right brachial plexus. I personally spoke with Dr. Orlene Erm about this pt. The only pathology report I have access to is from 08/14/14 which showed CLL according to peripheral blood cytometry. On 05/18/15 he underwent a CT of his chest, abdomen, and pelvis that demonstrated a slight increase of size in the right axillary mass compared to 03/18/14. There is encasement of the right axillary artery. No other areas of adenopathy is identified. There were indeterminate pulmonary nodules. The pt saw Dr. Jiles Crocker at the [facility] medical center and the plan at present was to continue ibrutinib and undergo biopsy of the right axilla to determine if his CLL has converted to a different histology such as a higher grade lymphoma, and if so, a different chemotherapy might be considered. Of note, his WBC 06/08/15 was 60.6, hemoglobin 13.8, platelets 319. On 04/22/15, Na 141, K 3.8, BON 8, creatinine 0.75, glucose 76  He reports pain before radiotherapy, but not sure if it improved after radiotherapy. The pt's wife notes that he started having pain and swelling in his right arm in November 2015. He then underwent brachial plexus surgery, in April 2016, to improve his symptoms, but the symptoms became  significantly worse after the surgery. The pt's wife also reports that he had complications, including blood clots during the surgery. He went to the ED on 04/18/15 with extreme right arm swelling and pain.  He is here today for the consideration of palliative radiotherapy to the right brachial plexus nodule. He experiences pain in his right shoulder and arm. He is on long acting MS Contin 45 mg, which was increased from 30 mg 1 week ago. He is also on short acting morphine 15 mg that he can take 1-2 tablets every hour prn. He goes to the [facility]. He saw Dr. Asa Lente last week to receive a pain block injection. It did not relive the pain, so he will get another one this coming Monday. He has lymphedema in the right arm and wears a lymph sleeve. He did massage therapy in the past. Edema in his legs is noted. Reports a poor appetite, dizziness, and constipation. The constipation is managed by OTC medication. Reports his pain is a 4/10 while medicated and a 12/10 when not. Reports no feeling in the right arm and not being able to move the arm. The pt expresses that he is unable to lie down due to his arm pain.  PREVIOUS RADIATION THERAPY: Yes. 25.2 Gy completed on 05/08/14 at 1.8 Gy/fraction by Dr. Orlene Erm to the right axilla  PAST MEDICAL HISTORY:  has a past medical history of Squamous cell carcinoma; Leukocytosis; Chronic lymphocytic leukemia; Neuropathy; Lymphadenopathy; Pneumonia; and Headache.    PAST SURGICAL HISTORY: Past Surgical History  Procedure Laterality Date  .  Squamous cell carcinoma excision    . Lymph node biopsy    . Tonsillectomy    . Appendectomy    . Radiology with anesthesia N/A 12/24/2014    Procedure: MRI CERVICAL SPINE WITH/WITHOUT CONTRAST/MRI CHEST WITH/WITHOUT CONTRAST;  Surgeon: Medication Radiologist, MD;  Location: [place];  Service: Radiology;  Laterality: N/A;  MRI/DR. DEQUINCY A. LEWIS    FAMILY HISTORY: family history includes Healthy in his daughter and son;  Other in his brother; Other (age of onset: 55) in his father; Pancreatic cancer (age of onset: 23) in his mother.  SOCIAL HISTORY:  reports that he has been smoking Cigarettes.  He has a 90 pack-year smoking history. He has never used smokeless tobacco. He reports that he drinks alcohol. He reports that he does not use illicit drugs.  ALLERGIES: Review of patient's allergies indicates no known allergies.  MEDICATIONS:  Current Outpatient Prescriptions  Medication Sig Dispense Refill  . ibrutinib (IMBRUVICA) 140 MG capsul Take 420 mg by mouth daily.    Marland Kitchen morphine (MS CONTIN) 15 MG 12 hr tablet Take 45 mg by mouth every 8 (eight) hours. Take 3 tablets by mouth every 8 hours    . morphine (MSIR) 15 MG tablet Take 15 mg by mouth every 2 (two) hours as needed for severe pain.    . pregabalin (LYRICA) 100 MG capsule Take 100 mg by mouth 2 (two) times daily.    . temazepam (RESTORIL) 15 MG capsule Take 15 mg by mouth at bedtime as needed for sleep.    . B Complex Vitamins (VITAMIN-B COMPLEX) TABS Take 2 tablets by mouth 2 (two) times daily.     Marland Kitchen enoxaparin (LOVENOX) 80 MG/0.8ML injection Inject 80 mg into the skin every 12 (twelve) hours.    . nortriptyline (PAMELOR) 10 MG capsule Take 1 tablet at bedtime x 2 weeks, then increase to 2 tablets (Patient not taking: Reported on 06/16/2015) 60 capsule 5  . oxyCODONE-acetaminophen (PERCOCET/ROXICET) 5-325 MG per tablet Take 2 tablets by mouth every 8 (eight) hours as needed for severe pain. (Patient not taking: Reported on 06/16/2015) 10 tablet 0   No current facility-administered medications for this encounter.    REVIEW OF SYSTEMS:  Notable for that above.   PHYSICAL EXAM:  height is 5\' 7"  (1.702 m) and weight is 170 lb (77.111 kg). His oral temperature is 98.1 F (36.7 C). His blood pressure is 108/64 and his pulse is 74. His respiration is 16 and oxygen saturation is 100%.  Pt presents to the clinic in a wheelchair. General: Alert and oriented, in no  acute distress HEENT: Head is normocephalic. Extraocular movements are intact. Oropharynx is clear. Neck: Neck is supple, no palpable cervical or supraclavicular lymphadenopathy. Heart: Regular in rate and rhythm with no murmurs, rubs, or gallops. Chest: Clear to auscultation bilaterally, with no rhonchi, wheezes, or rales. Abdomen: Soft, nontender, nondistended, with no rigidity or guarding. Extremities: He has diffuse upper extremity swelling and loss of motor function in the right upper extremity. Lymph sleeve is in place on the right upper extremity. No obvious palpable axillary mass. Ankle edema b/l.  He needs to lift his right arm with his left hand. Lymphatics: see Neck Exam. Skin: Ecchymosis on his left upper extremity (pt notes it appeared 3 days ago without incident).  Neurologic: Cranial nerves II through XII are grossly intact. No obvious focalities.   Psychiatric: Judgment and insight are intact. Affect is appropriate.  ECOG = 3  0 - Asymptomatic (Fully active,  able to carry on all predisease activities without restriction)  1 - Symptomatic but completely ambulatory (Restricted in physically strenuous activity but ambulatory and able to carry out work of a light or sedentary nature. For example, light housework, office work)  2 - Symptomatic, <50% in bed during the day (Ambulatory and capable of all self care but unable to carry out any work activities. Up and about more than 50% of waking hours)  3 - Symptomatic, >50% in bed, but not bedbound (Capable of only limited self-care, confined to bed or chair 50% or more of waking hours)  4 - Bedbound (Completely disabled. Cannot carry on any self-care. Totally confined to bed or chair)  5 - Death   Eustace Pen MM, Creech RH, Tormey DC, et al. [phone]). "Toxicity and response criteria of the [facility]". [place] Oncol. 5 (6): 649-55   LABORATORY DATA:  Lab Results  Component Value Date   WBC 78.7* 04/18/2015     HGB 13.9 04/18/2015   HCT 41.0 04/18/2015   MCV 90.0 04/18/2015   PLT 322 04/18/2015   CMP     Component Value Date/Time   NA 136 04/18/2015 2303   K 3.6 04/18/2015 2303   CL 101 04/18/2015 2303   CO2 29 12/24/2014 0622   GLUCOSE 133* 04/18/2015 2303   BUN 10 04/18/2015 2303   CREATININE 0.80 04/18/2015 2303   CALCIUM 9.5 12/24/2014 0622   GFRNONAA 87* 12/24/2014 0622   GFRAA >90 12/24/2014 0622         RADIOGRAPHY: as above    IMPRESSION/PLAN: We had a length discussion today.  After piecing together his history, it seems that after he received radiation a year ago, it has not provided improvement in the pt's symptoms or quality of life. Reirradation is possible, but unlikely to provide meaningful long term symptom management.  It will not improve his right arm plexopathy, as it appears he has permanent nerve damage.  RT could possibly help local pain in the axilla. I think biopsy of the mass is a good idea. If there is a change in the histology, it might be worthwhile to find changes in systemic treatment. However, if he ultimately does not have satisfactory palliation from systemic therapy, or cannot tolerate it well, there is "room" for more radiation dose to the axillary tissues as he has not received a dose close to plexus tolerance. He is concerned his plexopathy was due to RT but I feel it is more likely r/t  tumor itself +/- effects of surgery earlier this year.  He will f/u with me PRN.  I am happy to see him for re consultation at any time.      This document serves as a record of services personally performed by Eppie Gibson, MD. It was created on her behalf by Darcus Austin, a trained medical scribe. The creation of this record is based on the scribe's personal observations and the provider's statements to them. This document has been checked and approved by the attending provider.     __________________________________________   Eppie Gibson, MD

## 2015-06-16 NOTE — Progress Notes (Signed)
Histology and Location of Primary Cancer: CLL diagnosed 09/2011  Location(s) of Symptomatic tumor(s): right axillary lymph node, right arm  Past/Anticipated chemotherapy by medical oncology, if any: currently taking Imbruvica, started 10/2014.[facility] [place] consulting medical oncology.  Patient's main complaints related to symptomatic tumor(s) are: pain of right brachial plexus  Pain on a scale of 0-10 is: 4/10 with medication 12/10 without medication  SAFETY ISSUES:  Prior radiation? Yes - to right axilla at [facility] approximately 1 yr.ago; believes he received 15 treatments  Pacemaker/ICD? no  Possible current pregnancy? no  Is the patient on methotrexate? no  Additional Complaints / other details:  69 year old male. Current everyday smoker.  Reports taking Morphine long acting 45 mg every eight hours for pain. Reports this was increased from 30 mg one week ago. Also, patient reports taking morphine 15 mg immediate release 1-2 tablets every two hours for break through pain. Reports Dr. Asa Lente of [facility] administered a pain block two weeks ago "that didn't work" and is scheduled for a second on Monday. Right arm lymphedema sleeve noted. Patient reports his right arm is numb. Reports he is unable to move his right arm at all. Reports dizziness and decreased appetite. Reports pain 4 on a scale of 0-10 with medication but, 12 with movement and without medication. Edema of bilateral extremities noted. Reports sacral bed sores. Reports he is managing constipation with otc medication.

## 2015-06-17 ENCOUNTER — Telehealth: Payer: Self-pay | Admitting: *Deleted

## 2015-06-17 NOTE — Telephone Encounter (Signed)
On 06-17-15 fax medical records to Dr. Jiles Crocker it was consult note.

## 2015-06-18 ENCOUNTER — Telehealth: Payer: Self-pay | Admitting: Hematology and Oncology

## 2015-06-18 NOTE — Telephone Encounter (Signed)
new patient appt-s/w patient wife Darryl Horton and gave np appt for 08/10 @ 12 w/Dr. Alvy Bimler Referring [facility]  Dx-CLL   Referral information scanned

## 2015-06-21 NOTE — Pre-Procedure Instructions (Signed)
   ROHEN KIMES  06/21/2015     Your procedure is scheduled on : Thursday July 01, 2015 at 8:00 AM.  Report to [facility] Admitting at 6:00 A.M.  Call this number if you have problems the morning of surgery: [phone]    Remember:  Do not eat food or drink liquids after midnight.  Take these medicines the morning of surgery with A SIP OF WATER : Morphine (MS Contin)   Do not wear jewelry.  Do not wear lotions, powders, or cologne.   Men may shave face and neck.  Do not bring valuables to the hospital.  [facility] is not responsible for any belongings or valuables.  Contacts, dentures or bridgework may not be worn into surgery.  Leave your suitcase in the car.  After surgery it may be brought to your room.  For patients admitted to the hospital, discharge time will be determined by your treatment team.  Patients discharged the day of surgery will not be allowed to drive home.   Name and phone number of your driver:    Special instructions:   Please read over the following fact sheets that you were given. Pain Booklet, Coughing and Deep Breathing and Surgical Site Infection Prevention

## 2015-06-22 ENCOUNTER — Encounter (HOSPITAL_COMMUNITY)
Admission: RE | Admit: 2015-06-22 | Discharge: 2015-06-22 | Disposition: A | Discharge status: Home / Self Care | Payer: Non-veteran care | Source: Ambulatory Visit | Attending: Orthopedic Surgery | Admitting: Orthopedic Surgery

## 2015-06-22 ENCOUNTER — Encounter (HOSPITAL_COMMUNITY): Payer: Self-pay

## 2015-06-22 DIAGNOSIS — C9110 Chronic lymphocytic leukemia of B-cell type not having achieved remission: Secondary | ICD-10-CM | POA: Diagnosis not present

## 2015-06-22 DIAGNOSIS — F17200 Nicotine dependence, unspecified, uncomplicated: Secondary | ICD-10-CM | POA: Diagnosis not present

## 2015-06-22 DIAGNOSIS — Z86718 Personal history of other venous thrombosis and embolism: Secondary | ICD-10-CM | POA: Diagnosis not present

## 2015-06-22 DIAGNOSIS — Z01812 Encounter for preprocedural laboratory examination: Secondary | ICD-10-CM | POA: Diagnosis not present

## 2015-06-22 DIAGNOSIS — Z79899 Other long term (current) drug therapy: Secondary | ICD-10-CM | POA: Diagnosis not present

## 2015-06-22 DIAGNOSIS — Z01818 Encounter for other preprocedural examination: Secondary | ICD-10-CM | POA: Insufficient documentation

## 2015-06-22 DIAGNOSIS — G629 Polyneuropathy, unspecified: Secondary | ICD-10-CM | POA: Insufficient documentation

## 2015-06-22 HISTORY — DX: Drug induced constipation: K59.03

## 2015-06-22 LAB — CBC
HCT: 43.8 % (ref 39.0–52.0)
HEMOGLOBIN: 14.2 g/dL (ref 13.0–17.0)
MCH: 28.3 pg (ref 26.0–34.0)
MCHC: 32.4 g/dL (ref 30.0–36.0)
MCV: 87.3 fL (ref 78.0–100.0)
Platelets: 362 10*3/uL (ref 150–400)
RBC: 5.02 MIL/uL (ref 4.22–5.81)
RDW: 14.7 % (ref 11.5–15.5)
WBC: 59.7 10*3/uL (ref 4.0–10.5)

## 2015-06-22 LAB — DIFFERENTIAL
BASOS ABS: 0 10*3/uL (ref 0.0–0.1)
Band Neutrophils: 0 % (ref 0–10)
Basophils Relative: 0 % (ref 0–1)
Blasts: 0 %
EOS ABS: 0 10*3/uL (ref 0.0–0.7)
Eosinophils Relative: 0 % (ref 0–5)
LYMPHS ABS: 50.1 10*3/uL — AB (ref 0.7–4.0)
Lymphocytes Relative: 84 % — ABNORMAL HIGH (ref 12–46)
METAMYELOCYTES PCT: 0 %
Monocytes Absolute: 1.8 10*3/uL — ABNORMAL HIGH (ref 0.1–1.0)
Monocytes Relative: 3 % (ref 3–12)
Myelocytes: 0 %
NEUTROS ABS: 7.8 10*3/uL — AB (ref 1.7–7.7)
NEUTROS PCT: 13 % — AB (ref 43–77)
Promyelocytes Absolute: 0 %
nRBC: 0 /100 WBC

## 2015-06-22 LAB — BASIC METABOLIC PANEL
ANION GAP: 8 (ref 5–15)
BUN: 8 mg/dL (ref 6–20)
CALCIUM: 9.2 mg/dL (ref 8.9–10.3)
CHLORIDE: 98 mmol/L — AB (ref 101–111)
CO2: 26 mmol/L (ref 22–32)
CREATININE: 0.8 mg/dL (ref 0.61–1.24)
GFR calc Af Amer: >60 mL/min (ref >60)
GFR calc non Af Amer: >60 mL/min (ref >60)
Glucose, Bld: 100 mg/dL — ABNORMAL HIGH (ref 65–99)
Potassium: 4.2 mmol/L (ref 3.5–5.1)
Sodium: 132 mmol/L — ABNORMAL LOW (ref 135–145)

## 2015-06-22 NOTE — Progress Notes (Signed)
Patient arrived to PAT via wheelchair with his daughter.   Patient denied having any acute cardiac or pulmonary issues.   Patient stated he had a stress test at [facility], but was unsure as to exactly when it occurred. Patient denied having a cardiac cath or sleep study. Will request records.  Nurse inquired about which physician ordered for him to have a MRI and he stated it was Dr. Jiles Crocker. Will request H&P from Dr. Rico Sheehan at [facility] in [place].

## 2015-06-23 ENCOUNTER — Encounter (INDEPENDENT_AMBULATORY_CARE_PROVIDER_SITE_OTHER): Payer: Self-pay

## 2015-06-23 ENCOUNTER — Encounter: Payer: Self-pay | Admitting: Hematology and Oncology

## 2015-06-23 ENCOUNTER — Ambulatory Visit (HOSPITAL_BASED_OUTPATIENT_CLINIC_OR_DEPARTMENT_OTHER): Payer: Medicare Other | Admitting: Hematology and Oncology

## 2015-06-23 ENCOUNTER — Ambulatory Visit: Payer: Medicare Other

## 2015-06-23 ENCOUNTER — Encounter: Payer: Self-pay | Admitting: *Deleted

## 2015-06-23 VITALS — BP 130/68 | HR 70 | Temp 98.1°F | Resp 18 | Ht 67.0 in | Wt 160.9 lb

## 2015-06-23 DIAGNOSIS — M792 Neuralgia and neuritis, unspecified: Secondary | ICD-10-CM

## 2015-06-23 DIAGNOSIS — C9110 Chronic lymphocytic leukemia of B-cell type not having achieved remission: Secondary | ICD-10-CM

## 2015-06-23 DIAGNOSIS — G5680 Other specified mononeuropathies of unspecified upper limb: Secondary | ICD-10-CM | POA: Insufficient documentation

## 2015-06-23 MED ORDER — DEXAMETHASONE 4 MG PO TABS: 4.0000 mg | ORAL_TABLET | Freq: Every day | ORAL | Status: AC

## 2015-06-23 MED ORDER — CARBAMAZEPINE 200 MG PO TABS: 200.0000 mg | ORAL_TABLET | Freq: Two times a day (BID) | ORAL | Status: AC

## 2015-06-23 NOTE — Progress Notes (Signed)
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 male is here for second opinion. The patient was diagnosed with CLL. I review his records extensively. 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   02/22/2015 Surgery the patient underwent brachial plexus release surgery at [facility]   02/28/2015 Imaging ultrasound venous Doppler elsewhere show right axillary vein thrombosis. He was anticoagulated with Lovenox for 3 months   03/19/2015 Imaging CT scan elsewhere show 5.84.5 cm right axillary mass   05/20/2015 Imaging CT scan elsewhere shows 65.3 cm right axillary mass as well as splenomegaly and bilateral lung nodules   Currently, he has severe, uncontrolled neuropathic pain in the right upper extremity region. It is described as sharp and burning pain intermittently, that comes in waves He is rating his pain currently as 6 out of 10 pain. The patient has a surgical release of brachial plexus and had radiation in the past. He also follows with the pain specialist but still have uncontrolled pain. His right arm is nonfunctional with chronic diffuse edema. The patient is right-handed and he uses left hand for most activities of daily living. The pain is so debilitating  that he is virtually bed bound most of the time. He also have chronic bilateral lower extremity edema. He has approximately 40 pound weight loss over the past year related to loss of appetite related to pain. He just completed a course of anticoagulation therapy for postoperative right upper extremity DVT.   MEDICAL HISTORY:  Past Medical History  Diagnosis Date  . Squamous cell carcinoma   . Leukocytosis   . Chronic lymphocytic leukemia   . Neuropathy   . Lymphadenopathy   . Headache     cluster headaches years ago  . Pneumonia 2015  . Constipation due to pain medication     SURGICAL HISTORY: Past Surgical History  Procedure Laterality Date  . Squamous cell carcinoma excision      Right bicep  . Lymph node biopsy    . Tonsillectomy    . Appendectomy    . Radiology with anesthesia N/A 12/24/2014    Procedure: MRI CERVICAL SPINE WITH/WITHOUT CONTRAST/MRI CHEST WITH/WITHOUT CONTRAST;  Surgeon: Medication Radiologist, MD;  Location: [place];  Service: Radiology;  Laterality: N/A;  MRI/DR. DEQUINCY A. LEWIS    SOCIAL HISTORY: Social History   Social History  . Marital Status: Single    Spouse Name: N/A  . Number of Children: N/A  . Years of Education: N/A   Occupational History  . Not on file.   Social History Main Topics  . Smoking status: Current Every Day Smoker -- 1.00 packs/day for 45 years    Types: Cigarettes  . Smokeless tobacco: Never Used  . Alcohol Use: No     Comment: very rare  . Drug Use: No  . Sexual Activity: No  Other Topics Concern  . Not on file   Social History Narrative   Lives with wife.   He runs a Control and instrumentation engineer.   Highest level of education:  B.S.       FAMILY HISTORY: Family History  Problem Relation Age of Onset  . Other Father 32    Deceased, rare blood disorder  . Pancreatic cancer Mother 67    Deceased  . Other Brother     Deceased  . Healthy Son   . Healthy Daughter     ALLERGIES:  has No Known Allergies.  MEDICATIONS:   Current Outpatient Prescriptions  Medication Sig Dispense Refill  . ibrutinib (IMBRUVICA) 140 MG capsul Take 420 mg by mouth daily.    Marland Kitchen morphine (MS CONTIN) 15 MG 12 hr tablet Take 45 mg by mouth every 8 (eight) hours.    Marland Kitchen morphine (MSIR) 15 MG tablet Take 15 mg by mouth every 2 (two) hours as needed for severe pain.    Marland Kitchen temazepam (RESTORIL) 15 MG capsule Take 15 mg by mouth at bedtime as needed for sleep.    . carbamazepine (TEGRETOL) 200 MG tablet Take 1 tablet (200 mg total) by mouth 2 (two) times daily. 60 tablet 0  . dexamethasone (DECADRON) 4 MG tablet Take 1 tablet (4 mg total) by mouth daily. 30 tablet 0   No current facility-administered medications for this visit.    REVIEW OF SYSTEMS:   Constitutional: Denies fevers, chills or abnormal night sweats Eyes: Denies blurriness of vision, double vision or watery eyes Ears, nose, mouth, throat, and face: Denies mucositis or sore throat Respiratory: Denies cough, dyspnea or wheezes Cardiovascular: Denies palpitation, chest discomfort  Gastrointestinal:  Denies nausea, heartburn or change in bowel habits Skin: Denies abnormal skin rashes Neurological:Denies numbness, tingling or new weaknesses Behavioral/Psych: Mood is stable, no new changes  All other systems were reviewed with the patient and are negative.  PHYSICAL EXAMINATION: ECOG PERFORMANCE STATUS: 2 - Symptomatic, <50% confined to bed  Filed Vitals:   06/23/15 1152  BP: 130/68  Pulse: 70  Temp: 98.1 F (36.7 C)  Resp: 18   Filed Weights   06/23/15 1152  Weight: 160 lb 14.4 oz (72.984 kg)    GENERAL:alert, no distress and comfortable. He is uncomfortable due to pain and sitting on the wheelchair SKIN: skin color, texture, turgor are normal, no rashes or significant lesions EYES: normal, conjunctiva are pink and non-injected, sclera clear OROPHARYNX:no exudate, no erythema and lips, buccal mucosa, and tongue normal  NECK: supple, thyroid normal size,  non-tender, without nodularity LYMPH:  no palpable lymphadenopathy in the cervical, left axillary region or inguinal LUNGS: clear to auscultation and percussion with normal breathing effort HEART: regular rate & rhythm and no murmurs and no lower extremity edema ABDOMEN:abdomen soft, non-tender and normal bowel sounds Musculoskeletal:no cyanosis of digits and no clubbing . He has diffuse right upper extremity edema limiting examination of the right axillary region PSYCH: alert & oriented x 3 with fluent speech NEURO: no focal motor/sensory deficits  LABORATORY DATA:  I have reviewed the data as listed Lab Results  Component Value Date   WBC 59.7* 06/22/2015   HGB 14.2 06/22/2015   HCT 43.8 06/22/2015   MCV 87.3 06/22/2015   PLT 362 06/22/2015    Recent Labs  12/24/14 0622 04/18/15 2303 06/22/15 1542  NA 133* 136 132*  K 5.1 3.6 4.2  CL 99 101 98*  CO2 29  --  26  GLUCOSE 85 133*  100*  BUN 10 10 8   CREATININE 0.89 0.80 0.80  CALCIUM 9.5  --  9.2  GFRNONAA 87*  --  >60  GFRAA >90  --  >60    RADIOGRAPHIC STUDIES: I reviewed all his imaging studies I have personally reviewed the radiological images as listed and agreed with the findings in the report.  ASSESSMENT & PLAN:  CLL (chronic lymphocytic leukemia) The patient is currently undergoing further investigation for planned right axillary mass biopsy, arranged by another oncologist, scheduled for 07/01/2015 to exclude high-grade lymphoma. I agree with the plan of care. In the meantime, he appears to be responding to Ibrutinib with reduction of total circulating white blood cell count even though recent imaging studies showed continued progression of the right axillary mass which I suspect could be of malignant transformation of lymphoma. I recommend he call me once he has results of the biopsy for further discussion about plan of care and possible systemic chemotherapy.  Brachial neuropathic pain The patient has severe  neuropathic pain on the right arm. We discussed various options. He is currently receiving care through a pain clinic. He has tried gabapentin and Lyrica in the past, both were discontinued due to lack of benefit and side effects. I recommend a trial of addition of 4 mg of dexamethasone daily after the biopsy is done. I recommend a trial of carbamazepine. I will see him back in a few weeks for further assessment of pain control.     All questions were answered. The patient knows to call the clinic with any problems, questions or concerns. I spent 40 minutes counseling the patient face to face. The total time spent in the appointment was 60 minutes and more than 50% was on counseling.     [facility], [place], MD 06/23/2015 3:44 PM

## 2015-06-23 NOTE — Progress Notes (Signed)
Checked in new patient with no financial concerns.Gave my card if assistance needed.

## 2015-06-23 NOTE — Assessment & Plan Note (Signed)
The patient is currently undergoing further investigation for planned right axillary mass biopsy, arranged by another oncologist, scheduled for 07/01/2015 to exclude high-grade lymphoma. I agree with the plan of care. In the meantime, he appears to be responding to Ibrutinib with reduction of total circulating white blood cell count even though recent imaging studies showed continued progression of the right axillary mass which I suspect could be of malignant transformation of lymphoma. I recommend he call me once he has results of the biopsy for further discussion about plan of care and possible systemic chemotherapy.

## 2015-06-23 NOTE — Assessment & Plan Note (Addendum)
The patient has severe neuropathic pain on the right arm. We discussed various options. He is currently receiving care through a pain clinic. He has tried gabapentin and Lyrica in the past, both were discontinued due to lack of benefit and side effects. I recommend a trial of addition of 4 mg of dexamethasone daily after the biopsy is done. I recommend a trial of carbamazepine. I will see him back in a few weeks for further assessment of pain control.

## 2015-06-23 NOTE — Progress Notes (Addendum)
Anesthesia Chart Review:  Pt is 68 year old male scheduled for MRI R humerus with anesthesia on 07/01/2015.   PMH includes: CLL, leukocytosis, DVT (RUE 03/2015) squamous cell carcinoma, lymphadenopathy, neuropathy. Current smoker. BMI 25. S/p MRI with anesthesia 12/24/14.  Medications include: tegretol, decadron, ibrutinib, morphine.   There is an H&P from pt's oncologist at the [facility], Dr. Rico Sheehan, dated 06/08/15 on paper chart. There is also an H&P in Epic dated 06/16/15 by Dr. Eppie Gibson with radiation oncology.   Preoperative labs reviewed.  WBC 59.7 is consistent with previous results and CLL.   Echo 06/08/2010: 1. Preserved LV EF 55% 2. Mild TR and pulmonary insufficiency 3. Mild LA enlargement 4. Intraatrial septal aneurysm  If no changes, I anticipate pt can proceed with surgery as scheduled.   Willeen Cass, FNP-BC [facility]/Anesthesiology Phone: [phone] 06/23/2015 1:51 PM

## 2015-06-25 ENCOUNTER — Ambulatory Visit: Payer: Medicare Other

## 2015-06-25 ENCOUNTER — Ambulatory Visit: Payer: Medicare Other | Admitting: Radiation Oncology

## 2015-07-01 ENCOUNTER — Encounter (HOSPITAL_COMMUNITY): Payer: Self-pay | Admitting: *Deleted

## 2015-07-01 ENCOUNTER — Ambulatory Visit (HOSPITAL_COMMUNITY): Payer: Non-veteran care | Admitting: Certified Registered Nurse Anesthetist

## 2015-07-01 ENCOUNTER — Ambulatory Visit (HOSPITAL_COMMUNITY)
Admission: RE | Admit: 2015-07-01 | Discharge: 2015-07-01 | Disposition: A | Discharge status: Home / Self Care | Payer: Medicare Other | Source: Ambulatory Visit | Attending: Internal Medicine | Admitting: Internal Medicine

## 2015-07-01 ENCOUNTER — Encounter (HOSPITAL_COMMUNITY)
Admission: RE | Disposition: A | Discharge status: Home / Self Care | Payer: Medicare Other | Source: Ambulatory Visit | Attending: Orthopedic Surgery

## 2015-07-01 ENCOUNTER — Ambulatory Visit (HOSPITAL_COMMUNITY): Payer: Non-veteran care

## 2015-07-01 ENCOUNTER — Ambulatory Visit (HOSPITAL_COMMUNITY): Payer: Non-veteran care | Admitting: Vascular Surgery

## 2015-07-01 ENCOUNTER — Ambulatory Visit (HOSPITAL_COMMUNITY)
Admission: RE | Admit: 2015-07-01 | Discharge: 2015-07-01 | Disposition: A | Discharge status: Home / Self Care | Payer: Non-veteran care | Source: Ambulatory Visit | Attending: Orthopedic Surgery | Admitting: Orthopedic Surgery

## 2015-07-01 DIAGNOSIS — F17210 Nicotine dependence, cigarettes, uncomplicated: Secondary | ICD-10-CM | POA: Insufficient documentation

## 2015-07-01 DIAGNOSIS — G629 Polyneuropathy, unspecified: Secondary | ICD-10-CM | POA: Diagnosis not present

## 2015-07-01 DIAGNOSIS — Z85828 Personal history of other malignant neoplasm of skin: Secondary | ICD-10-CM | POA: Diagnosis not present

## 2015-07-01 DIAGNOSIS — Z79899 Other long term (current) drug therapy: Secondary | ICD-10-CM | POA: Diagnosis not present

## 2015-07-01 DIAGNOSIS — C9110 Chronic lymphocytic leukemia of B-cell type not having achieved remission: Secondary | ICD-10-CM | POA: Insufficient documentation

## 2015-07-01 DIAGNOSIS — R609 Edema, unspecified: Secondary | ICD-10-CM | POA: Insufficient documentation

## 2015-07-01 DIAGNOSIS — Z923 Personal history of irradiation: Secondary | ICD-10-CM | POA: Diagnosis not present

## 2015-07-01 DIAGNOSIS — C7989 Secondary malignant neoplasm of other specified sites: Secondary | ICD-10-CM | POA: Diagnosis not present

## 2015-07-01 DIAGNOSIS — R2230 Localized swelling, mass and lump, unspecified upper limb: Secondary | ICD-10-CM | POA: Insufficient documentation

## 2015-07-01 DIAGNOSIS — R2231 Localized swelling, mass and lump, right upper limb: Secondary | ICD-10-CM | POA: Diagnosis present

## 2015-07-01 DIAGNOSIS — I890 Lymphedema, not elsewhere classified: Secondary | ICD-10-CM | POA: Diagnosis not present

## 2015-07-01 HISTORY — PX: RADIOLOGY WITH ANESTHESIA: SHX6223

## 2015-07-01 SURGERY — RADIOLOGY WITH ANESTHESIA
Anesthesia: General | Laterality: Right

## 2015-07-01 MED ORDER — ONDANSETRON HCL 4 MG/2ML IJ SOLN
INTRAMUSCULAR | Status: DC | PRN
Start: 2015-07-01 — End: 2015-07-01
  Administered 2015-07-01: 4 mg via INTRAVENOUS

## 2015-07-01 MED ORDER — FENTANYL CITRATE (PF) 100 MCG/2ML IJ SOLN: 25.0000 ug | INTRAMUSCULAR | Status: DC | PRN

## 2015-07-01 MED ORDER — LACTATED RINGERS IV SOLN
Freq: Once | INTRAVENOUS | Status: AC
  Administered 2015-07-01: 11:00:00 via INTRAVENOUS

## 2015-07-01 MED ORDER — NEOSTIGMINE METHYLSULFATE 10 MG/10ML IV SOLN
INTRAVENOUS | Status: DC | PRN
  Administered 2015-07-01: 3 mg via INTRAVENOUS

## 2015-07-01 MED ORDER — LACTATED RINGERS IV SOLN
INTRAVENOUS | Status: DC | PRN
  Administered 2015-07-01: 15:00:00 via INTRAVENOUS

## 2015-07-01 MED ORDER — PROMETHAZINE HCL 25 MG/ML IJ SOLN: 6.2500 mg | INTRAMUSCULAR | Status: DC | PRN

## 2015-07-01 MED ORDER — GLYCOPYRROLATE 0.2 MG/ML IJ SOLN
INTRAMUSCULAR | Status: DC | PRN
  Administered 2015-07-01: 0.4 mg via INTRAVENOUS

## 2015-07-01 MED ORDER — SODIUM CHLORIDE 0.9 % IJ SOLN
INTRAMUSCULAR | Status: DC
Start: 2015-07-01 — End: 2015-07-02
  Filled 2015-07-01: qty 10

## 2015-07-01 MED ORDER — PROPOFOL 10 MG/ML IV BOLUS
INTRAVENOUS | Status: DC | PRN
  Administered 2015-07-01: 50 mg via INTRAVENOUS

## 2015-07-01 MED ORDER — GADOBENATE DIMEGLUMINE 529 MG/ML IV SOLN: 15.0000 mL | Freq: Once | INTRAVENOUS | Status: DC | PRN

## 2015-07-01 MED ORDER — LACTATED RINGERS IV SOLN: Freq: Once | INTRAVENOUS | Status: DC

## 2015-07-01 NOTE — Anesthesia Preprocedure Evaluation (Signed)
Anesthesia Evaluation  Patient identified by MRN, date of birth, ID band Patient awake    Reviewed: Allergy & Precautions, NPO status , Patient's Chart, lab work & pertinent test results  Airway Mallampati: II  TM Distance: >3 FB Neck ROM: Full    Dental   Pulmonary Current Smoker,  breath sounds clear to auscultation        Cardiovascular negative cardio ROS  Rhythm:Regular Rate:Normal     Neuro/Psych Neuropathic pain   Neuromuscular disease    GI/Hepatic negative GI ROS, Neg liver ROS,   Endo/Other  negative endocrine ROS  Renal/GU negative Renal ROS     Musculoskeletal   Abdominal   Peds  Hematology CLL.    Anesthesia Other Findings   Reproductive/Obstetrics                             Anesthesia Physical Anesthesia Plan  ASA: III  Anesthesia Plan: General   Post-op Pain Management:    Induction: Intravenous  Airway Management Planned: LMA  Additional Equipment:   Intra-op Plan:   Post-operative Plan: Extubation in OR  Informed Consent: I have reviewed the patients History and Physical, chart, labs and discussed the procedure including the risks, benefits and alternatives for the proposed anesthesia with the patient or authorized representative who has indicated his/her understanding and acceptance.   Dental advisory given  Plan Discussed with: CRNA  Anesthesia Plan Comments:         Anesthesia Quick Evaluation

## 2015-07-01 NOTE — Progress Notes (Signed)
Patient complaining of pain.  Has taken MS IR 15mg  per Dr. Ola Spurr.  8/10

## 2015-07-01 NOTE — H&P (Signed)
Chief Complaint: Patient was seen in consultation today for right axillary mass at the request of Dr. Jiles Crocker  Referring Physician(s): Dr. Jiles Crocker  History of Present Illness: Darryl Horton is a 68 y.o. 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 The patient is scheduled to undergo a MRI today with general anesthesia and IR received request for axillary mass biopsy during anesthesia time. He denies any chest pain, shortness of breath or palpitations. He denies any active signs of bleeding or excessive bruising. The patient denies any history of sleep apnea or chronic oxygen use.    Past Medical History  Diagnosis Date  . Squamous cell carcinoma   . Leukocytosis   . Chronic lymphocytic leukemia   . Neuropathy   . Lymphadenopathy   . Headache     cluster headaches years ago  . Pneumonia 2015  . Constipation due to pain medication     Past Surgical History  Procedure Laterality Date  . Squamous cell carcinoma excision      Right bicep  . Lymph node biopsy    . Tonsillectomy    . Appendectomy    . Radiology with anesthesia N/A 12/24/2014    Procedure: MRI CERVICAL SPINE WITH/WITHOUT CONTRAST/MRI CHEST WITH/WITHOUT CONTRAST;  Surgeon: Medication Radiologist, MD;  Location: [place];  Service: Radiology;  Laterality: N/A;  MRI/DR. DEQUINCY A. LEWIS    Allergies: Review of patient's allergies indicates no known allergies.  Medications: Prior to Admission medications   Medication Sig Start Date End Date Taking? Authorizing Provider  carbamazepine (TEGRETOL) 200 MG tablet Take 1 tablet (200 mg total) by mouth 2 (two) times daily. 06/23/15  Yes Heath Lark, MD  dexamethasone (DECADRON) 4 MG tablet Take 1 tablet (4 mg total) by mouth daily. 06/23/15  Yes Heath Lark, MD  ibrutinib (IMBRUVICA) 140 MG capsul Take 420 mg by mouth daily.   Yes Historical Provider, MD  morphine (MS CONTIN) 15 MG 12 hr tablet Take 45 mg by mouth every 8 (eight) hours.   Yes  Historical Provider, MD  morphine (MSIR) 15 MG tablet Take 15 mg by mouth every 2 (two) hours as needed for severe pain.   Yes Historical Provider, MD  temazepam (RESTORIL) 15 MG capsule Take 15 mg by mouth at bedtime as needed for sleep.    Historical Provider, MD     Family History  Problem Relation Age of Onset  . Other Father 82    Deceased, rare blood disorder  . Pancreatic cancer Mother 41    Deceased  . Other Brother     Deceased  . Healthy Son   . Healthy Daughter     Social History   Social History  . Marital Status: Single    Spouse Name: N/A  . Number of Children: N/A  . Years of Education: N/A   Social History Main Topics  . Smoking status: Current Every Day Smoker -- 1.00 packs/day for 45 years    Types: Cigarettes  . Smokeless tobacco: Never Used  . Alcohol Use: No     Comment: very rare  . Drug Use: No  . Sexual Activity: No   Other Topics Concern  . None   Social History Narrative   Lives with wife.   He runs a Control and instrumentation engineer.   Highest level of education:  B.S.      Review of Systems: A 12 point ROS discussed and pertinent positives are indicated in the HPI above.  All  other systems are negative.  Review of Systems  Vital Signs: BP 146/62 mmHg  Pulse 60  Temp(Src) 98.3 F (36.8 C) (Oral)  Resp 18  Ht 5\' 7"  (1.702 m)  Wt 160 lb (72.576 kg)  BMI 25.05 kg/m2  SpO2 96%  Physical Exam  Constitutional: He is oriented to person, place, and time. No distress.  HENT:  Head: Normocephalic and atraumatic.  Cardiovascular: Normal rate and regular rhythm.  Exam reveals no gallop and no friction rub.   No murmur heard. Pulmonary/Chest: Effort normal and breath sounds normal. No respiratory distress. He has no wheezes. He has no rales.  Musculoskeletal: He exhibits edema and tenderness.  Neurological: He is alert and oriented to person, place, and time.  Skin: He is not diaphoretic.    Mallampati Score:  MD Evaluation Airway: WNL Heart:  WNL Abdomen: WNL Chest/ Lungs: WNL ASA  Classification: 3 Mallampati/Airway Score: Two  Imaging: No results found.  Labs:  CBC:  Recent Labs  12/24/14 0622 04/18/15 2246 04/18/15 2303 06/22/15 1542  WBC 196.8* 78.7*  --  59.7*  HGB 15.6 13.7 13.9 14.2  HCT 47.4 42.4 41.0 43.8  PLT 316 322  --  362    COAGS: No results for input(s): INR, APTT in the last 8760 hours.  BMP:  Recent Labs  12/24/14 0622 04/18/15 2303 06/22/15 1542  NA 133* 136 132*  K 5.1 3.6 4.2  CL 99 101 98*  CO2 29  --  26  GLUCOSE 85 133* 100*  BUN 10 10 8   CALCIUM 9.5  --  9.2  CREATININE 0.89 0.80 0.80  GFRNONAA 87*  --  >60  GFRAA >90  --  >60    Assessment and Plan: CLL Right axillary mass Request for image guided biopsy The patient has been NPO, no blood thinners taken, labs and vitals have been reviewed. Risks and Benefits discussed with the patient including, but not limited to bleeding, infection, damage to adjacent structures or low yield requiring additional tests. All of the patient's questions were answered, patient is agreeable to proceed. Consent signed and in chart.   Thank you for this interesting consult.  I greatly enjoyed meeting Darryl Horton and look forward to participating in their care.  A copy of this report was sent to the requesting provider on this date.  SignedHedy Jacob 07/01/2015, 10:47 AM   I spent a total of 15 Minutes in face to face in clinical consultation, greater than 50% of which was counseling/coordinating care for right axillary mass.

## 2015-07-01 NOTE — Anesthesia Postprocedure Evaluation (Signed)
  Anesthesia Post-op Note  Patient: Darryl Horton  Procedure(s) Performed: Procedure(s): MRI RIGHT HUMERUS     (RADIOLOGY WITH ANESTHESIA) (Right)  Patient Location: PACU  Anesthesia Type:General  Level of Consciousness: awake and alert   Airway and Oxygen Therapy: Patient Spontanous Breathing  Post-op Pain: mild  Post-op Assessment: Post-op Vital signs reviewed and Patient's Cardiovascular Status Stable              Post-op Vital Signs: Reviewed and stable  Last Vitals:  Filed Vitals:   07/01/15 1630  BP: 144/79  Pulse: 107  Temp:   Resp: 20    Complications: No apparent anesthesia complications

## 2015-07-01 NOTE — Transfer of Care (Signed)
Immediate Anesthesia Transfer of Care Note  Patient: Darryl Horton  Procedure(s) Performed: Procedure(s): MRI RIGHT HUMERUS     (RADIOLOGY WITH ANESTHESIA) (Right)  Patient Location: PACU  Anesthesia Type:General  Level of Consciousness: awake, oriented and patient cooperative  Airway & Oxygen Therapy: Patient Spontanous Breathing and Patient connected to nasal cannula oxygen  Post-op Assessment: Report given to RN, Post -op Vital signs reviewed and stable and Patient moving all extremities  Post vital signs: Reviewed and stable  Last Vitals:  Filed Vitals:   07/01/15 0625  BP: 146/62  Pulse: 60  Temp: 36.8 C  Resp: 18    Complications: No apparent anesthesia complications

## 2015-07-01 NOTE — Anesthesia Procedure Notes (Signed)
Procedure Name: Intubation Date/Time: 07/01/2015 1:47 PM Performed by: Izora Gala Pre-anesthesia Checklist: Suction available, Patient identified, Emergency Drugs available and Patient being monitored Patient Re-evaluated:Patient Re-evaluated prior to inductionOxygen Delivery Method: Circle system utilized Preoxygenation: Pre-oxygenation with 100% oxygen Intubation Type: IV induction Ventilation: Mask ventilation without difficulty Laryngoscope Size: Miller and 2 Grade View: Grade II Tube type: Oral Tube size: 7.5 mm Number of attempts: 1 Placement Confirmation: ETT inserted through vocal cords under direct vision,  positive ETCO2 and breath sounds checked- equal and bilateral Secured at: 23 cm Tube secured with: Tape Dental Injury: Teeth and Oropharynx as per pre-operative assessment

## 2015-07-01 NOTE — Progress Notes (Signed)
Pt c/o severe chronic pain RUE-spouse at bedside with pt's po Morphine. Dr Smith Robert updated-OK to give pt's home dose of 45 mg po Morphine. Med given to pt by wife.

## 2015-07-02 ENCOUNTER — Encounter (HOSPITAL_COMMUNITY): Payer: Self-pay | Admitting: Radiology

## 2015-07-06 ENCOUNTER — Telehealth: Payer: Self-pay | Admitting: *Deleted

## 2015-07-06 NOTE — Telephone Encounter (Signed)
NEED DR.GORSUCH'S OFFICE NOTE. FAXED DR.GORSUCH'S OFFICE NOTE OF 06/23/15 TO FAX # [phone].

## 2015-07-06 NOTE — Telephone Encounter (Signed)
REQUEST FOR MEDICAL RECORDS FAX TO KIM HEGARTY IN HIM.

## 2015-07-09 MED FILL — Lidocaine HCl IV Inj 20 MG/ML: INTRAVENOUS | Qty: 5 | Status: AC

## 2015-07-09 MED FILL — Rocuronium Bromide IV Soln 100 MG/10ML (10 MG/ML): INTRAVENOUS | Qty: 10 | Status: AC

## 2015-07-09 MED FILL — Propofol IV Emul 200 MG/20ML (10 MG/ML): INTRAVENOUS | Qty: 20 | Status: AC

## 2015-07-09 MED FILL — Fentanyl Citrate Preservative Free (PF) Inj 100 MCG/2ML: INTRAMUSCULAR | Qty: 2 | Status: AC

## 2015-07-09 MED FILL — Midazolam HCl Inj 2 MG/2ML (Base Equivalent): INTRAMUSCULAR | Qty: 2 | Status: AC

## 2015-07-12 ENCOUNTER — Encounter: Payer: Self-pay | Admitting: *Deleted

## 2015-07-12 ENCOUNTER — Telehealth: Payer: Self-pay | Admitting: *Deleted

## 2015-07-12 ENCOUNTER — Other Ambulatory Visit: Payer: Self-pay | Admitting: Hematology and Oncology

## 2015-07-12 ENCOUNTER — Telehealth: Payer: Self-pay | Admitting: Hematology and Oncology

## 2015-07-12 NOTE — Telephone Encounter (Signed)
Biopsy results available and Dr. Alvy Bimler s/w pt's wife.

## 2015-07-12 NOTE — Progress Notes (Unsigned)
VM from Maryagnes Amos at [facility] in [place],  Oncology dept.  Requests biopsy results for Dr. Jiles Crocker.  Faxed the results to Dr. Jiles Crocker at fax [phone].   (phone number is [phone], ext [phone].

## 2015-07-12 NOTE — Telephone Encounter (Signed)
I have not heard Can you call his hematologist from [place] to see if they have results yet?

## 2015-07-12 NOTE — Telephone Encounter (Signed)
I reviewed the results with his wife to briefly over the phone. The patient is not doing too well even with the addition of Tegretol & dexamethasone. I will make him an appointment to see me in 2 days time to review test results and treatment recommendation

## 2015-07-12 NOTE — Telephone Encounter (Signed)
Wife asks if Dr. Alvy Bimler has gotten Biopsy results yet?  She says it was done over a week ago and they have not heard anything.

## 2015-07-13 ENCOUNTER — Telehealth: Payer: Self-pay | Admitting: Hematology and Oncology

## 2015-07-13 NOTE — Telephone Encounter (Signed)
Added appt per pof..per pof pt is aware

## 2015-07-14 ENCOUNTER — Encounter: Payer: Self-pay | Admitting: Hematology and Oncology

## 2015-07-14 ENCOUNTER — Ambulatory Visit (HOSPITAL_BASED_OUTPATIENT_CLINIC_OR_DEPARTMENT_OTHER): Payer: Medicare Other | Admitting: Hematology and Oncology

## 2015-07-14 VITALS — BP 123/60 | HR 70 | Temp 97.8°F | Resp 16 | Ht 67.0 in | Wt 163.0 lb

## 2015-07-14 DIAGNOSIS — C9110 Chronic lymphocytic leukemia of B-cell type not having achieved remission: Secondary | ICD-10-CM | POA: Diagnosis present

## 2015-07-14 DIAGNOSIS — G5680 Other specified mononeuropathies of unspecified upper limb: Secondary | ICD-10-CM

## 2015-07-14 DIAGNOSIS — C4492 Squamous cell carcinoma of skin, unspecified: Secondary | ICD-10-CM | POA: Insufficient documentation

## 2015-07-14 MED FILL — Ephedrine Sulf-NaCl Soln Pref Syr 50 MG/10ML-0.9% (5 MG/ML): INTRAVENOUS | Qty: 10 | Status: AC

## 2015-07-14 NOTE — Progress Notes (Signed)
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   02/22/2015 Surgery the patient underwent brachial plexus release surgery at [facility]   02/28/2015 Imaging ultrasound venous Doppler elsewhere show right axillary vein thrombosis. He was anticoagulated with Lovenox for 3 months   03/19/2015 Imaging CT scan elsewhere show 5.84.5 cm right axillary mass   05/20/2015 Imaging CT scan elsewhere shows 65.3 cm right axillary mass as well as splenomegaly and bilateral lung nodules    Squamous cell skin cancer   07/01/2015 Imaging  MRI of the right humerus show a large necrotic mass in the axilla.   07/01/2015 Pathology Results Accession: [id number]  biopsy confirmed metastatic squamous cell carcinoma   07/01/2015 Procedure  he underwent ultrasound-guided core biopsy    INTERVAL HISTORY: Please see below for problem oriented charting. He returns for further follow-up. He is still in debilitating pain and is dependent on his wife for activities of daily living. He also had new onset of sacral decubitus ulcer.  REVIEW OF SYSTEMS:   Constitutional: Denies fevers, chills or abnormal weight loss Eyes: Denies blurriness of vision Ears, nose, mouth, throat, and face: Denies mucositis or sore throat Respiratory: Denies cough, dyspnea or wheezes Cardiovascular: Denies palpitation, chest discomfort or lower extremity swelling Gastrointestinal:  Denies nausea, heartburn or  change in bowel habits Lymphatics: Denies new lymphadenopathy or easy bruising Neurological:Denies numbness, tingling or new weaknesses Behavioral/Psych: Mood is stable, no new changes  All other systems were reviewed with the patient and are negative.  I have reviewed the past medical history, past surgical history, social history and family history with the patient and they are unchanged from previous note.  ALLERGIES:  has No Known Allergies.  MEDICATIONS:  Current Outpatient Prescriptions  Medication Sig Dispense Refill  . carbamazepine (TEGRETOL) 200 MG tablet Take 1 tablet (200 mg total) by mouth 2 (two) times daily. 60 tablet 0  . dexamethasone (DECADRON) 4 MG tablet Take 1 tablet (4 mg total) by mouth daily. 30 tablet 0  . ibrutinib (IMBRUVICA) 140 MG capsul Take 420 mg by mouth daily.    Marland Kitchen morphine (MS CONTIN) 15 MG 12 hr tablet Take 45 mg by mouth every 8 (eight) hours.    Marland Kitchen morphine (MSIR) 15 MG tablet Take 15 mg by mouth every 2 (two) hours as needed for severe pain.    Marland Kitchen temazepam (RESTORIL) 15 MG capsule Take 15 mg by mouth at bedtime as needed for sleep.     No current facility-administered medications for this visit.    PHYSICAL EXAMINATION: ECOG PERFORMANCE STATUS: 2 - Symptomatic, <50% confined to bed  Filed Vitals:   07/14/15 1334  BP: 123/60  Pulse: 70  Temp: 97.8 F (36.6 C)  Resp: 16   Filed Weights   07/14/15 1334  Weight: 163 lb (73.936 kg)    GENERAL:alert, no distress and comfortable. The patient is in obvious pain , sitting on wheelchair SKIN: skin color, texture, turgor are normal, no rashes or significant lesions EYES: normal, Conjunctiva are  pink and non-injected, sclera clear Musculoskeletal:no cyanosis of digits and no clubbing . He has profound right upper extremity edema NEURO: alert & oriented x 3 with fluent speech, no focal motor/sensory deficits  LABORATORY DATA:  I have reviewed the data as listed    Component Value Date/Time    NA 132* 06/22/2015 1542   K 4.2 06/22/2015 1542   CL 98* 06/22/2015 1542   CO2 26 06/22/2015 1542   GLUCOSE 100* 06/22/2015 1542   BUN 8 06/22/2015 1542   CREATININE 0.80 06/22/2015 1542   CALCIUM 9.2 06/22/2015 1542   GFRNONAA >60 06/22/2015 1542   GFRAA >60 06/22/2015 1542    No results found for: SPEP, UPEP  Lab Results  Component Value Date   WBC 59.7* 06/22/2015   NEUTROABS 7.8* 06/22/2015   HGB 14.2 06/22/2015   HCT 43.8 06/22/2015   MCV 87.3 06/22/2015   PLT 362 06/22/2015      Chemistry      Component Value Date/Time   NA 132* 06/22/2015 1542   K 4.2 06/22/2015 1542   CL 98* 06/22/2015 1542   CO2 26 06/22/2015 1542   BUN 8 06/22/2015 1542   CREATININE 0.80 06/22/2015 1542      Component Value Date/Time   CALCIUM 9.2 06/22/2015 1542      ASSESSMENT & PLAN:  Squamous cell skin cancer  Unfortunately, the biopsy come for 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 He had multiple skin cancers removed from the right upper extremity in the past Disease burden is high. He had radiation therapy in the past without success of getting rid of the tumor. I reviewed with him treatment options including cisplatin, carboplatin or cetuximab. His performance status is very poor and he has significant disability related to that. He also has a new sacral decubitus ulcer due to poor mobility. Frankly, I do not believe he would be able to tolerate chemotherapy. At best, he might be able to tolerate cetuximab. However, he would need staging CT scan, chemotherapy education class, port placement  and blood draw prior to start treatment. I told him, at best, cetuximab might be able to retard the growth but without significant shrinkage even if he were to respond to treatment. I recommend him to consider palliative care consult at this point. I also offer him potential treatment option with hospice. The patient will get back to me next week after he discussed with his wife  and family members. He is interested to pursue palliative care consult right now.  Brachial neuropathic pain  He has significant debilitating pain. He has a pain management consult scheduled next week and I recommend he keeps that appointment  CLL (chronic lymphocytic leukemia)  He will continue Ibrutinib for now.   I gave him multiple patient education handout regarding different treatment options No orders of the defined types were placed in this encounter.   All questions were answered. The patient knows to call the clinic with any problems, questions or concerns. No barriers to learning was detected. I spent 30 minutes counseling the patient face to face. The total time spent in the appointment was 40 minutes and more than 50% was on counseling and review of test results     [facility], [place], MD 07/14/2015 4:28 PM

## 2015-07-14 NOTE — Assessment & Plan Note (Signed)
He will continue Ibrutinib for now.

## 2015-07-14 NOTE — Assessment & Plan Note (Signed)
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 He had multiple skin cancers removed from the right upper extremity in the past Disease burden is high. He had radiation therapy in the past without success of getting rid of the tumor. I reviewed with him treatment options including cisplatin, carboplatin or cetuximab. His performance status is very poor and he has significant disability related to that. He also has a new sacral decubitus ulcer due to poor mobility. Frankly, I do not believe he would be able to tolerate chemotherapy. At best, he might be able to tolerate cetuximab. However, he would need staging CT scan, chemotherapy education class, port placement  and blood draw prior to start treatment. I told him, at best, cetuximab might be able to retard the growth but without significant shrinkage even if he were to respond to treatment. I recommend him to consider palliative care consult at this point. I also offer him potential treatment option with hospice. The patient will get back to me next week after he discussed with his wife and family members. He is interested to pursue palliative care consult right now.

## 2015-07-14 NOTE — Assessment & Plan Note (Signed)
He has significant debilitating pain. He has a pain management consult scheduled next week and I recommend he keeps that appointment

## 2015-07-15 ENCOUNTER — Ambulatory Visit: Payer: Medicare Other | Admitting: Hematology and Oncology

## 2015-07-15 ENCOUNTER — Telehealth: Payer: Self-pay | Admitting: *Deleted

## 2015-07-15 ENCOUNTER — Encounter: Payer: Self-pay | Admitting: Hematology and Oncology

## 2015-07-15 NOTE — Telephone Encounter (Signed)
Called [facility] for Palliative Care Consult.  S/w Jocelyn Lamer in Referral Center.

## 2015-07-16 ENCOUNTER — Encounter: Payer: Self-pay | Admitting: Radiation Oncology

## 2015-07-16 NOTE — Progress Notes (Signed)
PHONE NOTE  I received  the biopsy results for Darryl Horton, which revealed squamous cell carcinoma, from Dr. Alvy Bimler, who has been in touch with the patient most recently.  Darryl. Prudy Feeler was curious if there is a possibility of re-treatment with RT.  He is not a good candidate for chemotherapy per Dr Calton Dach notes.  I called the patient and we discussed that retreatment is an option, but that RT is very unlikely to improve his arm function or swelling. It might help his pain/discomfort, but this is questionable considering that any damage to the nerves that is responsible for his pain will not be healed by RT.  The goal of RT would be to control the tumor growth and hopefully improve his discomfort (ie if the tumor regresses somewhat).  He made clear that his goal is quality of life, not longevity. He will discuss this with his wife and call again if interested in treatment. I encouraged him to consider re-consultation with Dr. Orlene Erm if he wants treatment closer  to home (pt lives in [place] and previously was treated there).  Agree with pending palliative care consult.  -----------------------------------  Eppie Gibson, MD

## 2015-07-21 ENCOUNTER — Telehealth: Payer: Self-pay | Admitting: *Deleted

## 2015-07-21 NOTE — Telephone Encounter (Signed)
S/w Billey Chang, NP w/ Palliative Care.   He saw pt this morning.  He reviewed pt's pain management and reinforced his ms contin and ms ir orders.  Pt not taking ms contin as often as ordered and Josh instructed pt/wife to take as ordered for better results.   (Pt usually only taking 2 ms contin TID instead of 3 as ordered).  Josh suggested pt use med box for ms contin and then use the ms ir q 2 hrs prn as ordered. He recommends Dr. Alvy Bimler add Cymbalta or Elavil.   Pt has admitted depression and elements of neuropathic pain.   Josh says pt is seeing Dr. Jiles Crocker tomorrow.  Pt is trying to decide if he wants to take treament, immunotherapy, or be enrolled in Hospice care.   Pt said he would only want to try treatment if it would help alleviate his pain or add to his quality of life.    Pt will discuss w/ Dr. Jiles Crocker tomorrow.  Josh had instructed pt to contact them if he is interested in Hospice.  He also completed a DNR form for pt's home.

## 2015-07-21 NOTE — Telephone Encounter (Signed)
-----   Message from Heath Lark, MD sent at 07/21/2015 10:42 AM EDT ----- Regarding: can you check Can you check if he has made contact with hospice/palliative care?

## 2015-07-21 NOTE — Telephone Encounter (Signed)
Pls call him tomorrow with decision We can add Cymbalta but I prefer to hold off for now due to recent medication changes

## 2015-07-26 ENCOUNTER — Telehealth: Payer: Self-pay | Admitting: *Deleted

## 2015-07-26 NOTE — Telephone Encounter (Signed)
S/w pt's spouse, Dottie.  She says Dr. Jiles Crocker told pt last week she thought immunotherapy would not benefit pt's pain or improve his quality of life very much.  Therefore pt had said he does not want any treatment and will probably enroll with Hospice.  Dr. Jiles Crocker told them she can work w/ [facility] and Hospice for orders if needed.   Wife says they have Josh's number to call him when they are ready.  But wife wants to talk to pt one more time about his wishes before giving Dr. Alvy Bimler a decision.  She asks that nurse call her back tomorrow.

## 2015-07-26 NOTE — Telephone Encounter (Signed)
-----   Message from Heath Lark, MD sent at 07/26/2015 10:31 AM EDT ----- Regarding: decision? Can you call and ask him what is his decision?

## 2015-07-27 ENCOUNTER — Telehealth: Payer: Self-pay | Admitting: *Deleted

## 2015-07-27 NOTE — Telephone Encounter (Signed)
S/w pt and he states he has decided to enroll in Hospice and has already contacted [place] w/ [facility].   Pt will call us if he needs anything.

## 2015-07-27 NOTE — Telephone Encounter (Signed)
-----   Message from Heath Lark, MD sent at 07/26/2015 10:31 AM EDT ----- Regarding: decision? Can you call and ask him what is his decision?

## 2015-12-27 IMAGING — US IR FLUORO GUIDE NDL PLMT / BX
1 series · 11 of 11 positions shown · non-contrast
Comparison: none

CLINICAL DATA: 58-year-old male with a history of chronic
lymphocytic leukemia and perineural tumor surrounding the right
brachial practice and right axillary artery. He has profound chronic
right upper extremity lymphedema and pain. Ultrasound-guided biopsy
is warranted to reveal evaluate for transformation.

EXAM:
IR US GUIDE NEEDLE PLACEMENT /BIOPSY
Date: 07/01/2015
PROCEDURE:
1. Ultrasound guided core biopsy right very neurovascular bundle
mass
ANESTHESIA/SEDATION:
General anesthesia provided by the anesthesiology service
MEDICATIONS:
None additional
TECHNIQUE: Informed consent was obtained from the patient following explanation
of the procedure, risks, benefits and alternatives. The patient
understands, agrees and consents for the procedure. All questions
were addressed. A time out was performed.

[Series 1: ir fluoro guide ndl plmt / bx · 0.08mm/px · 11 acquisitions, 11 frames shown]
[im 1/11]
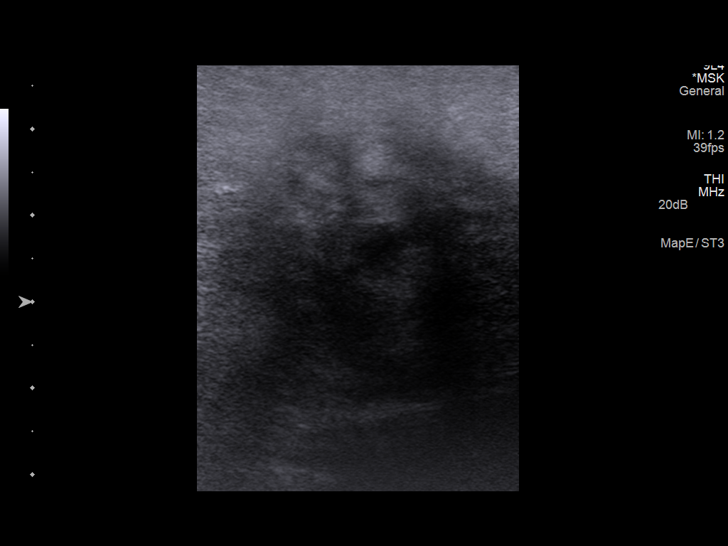
[im 2/11]
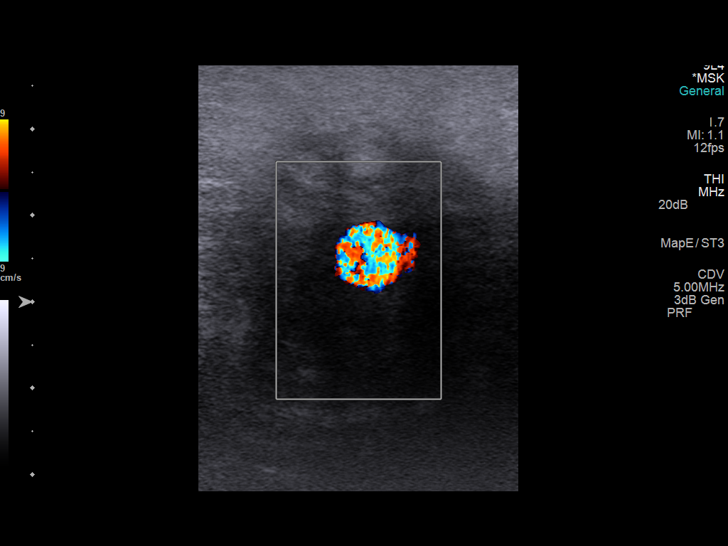
[im 3/11]
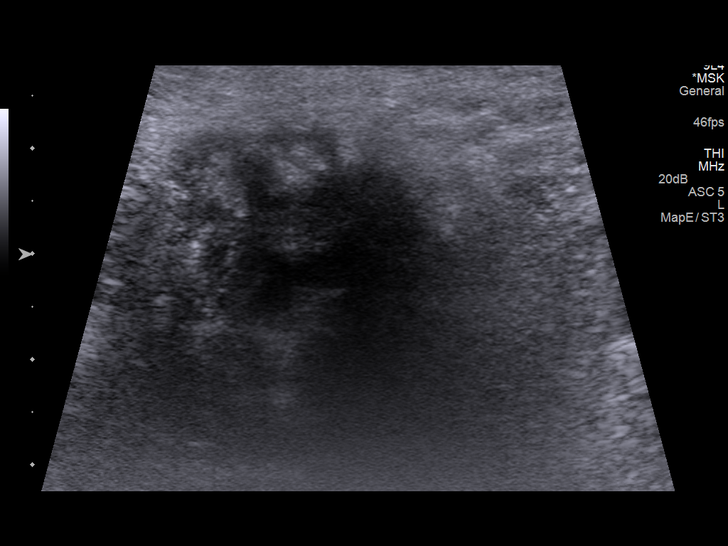
[im 4/11]
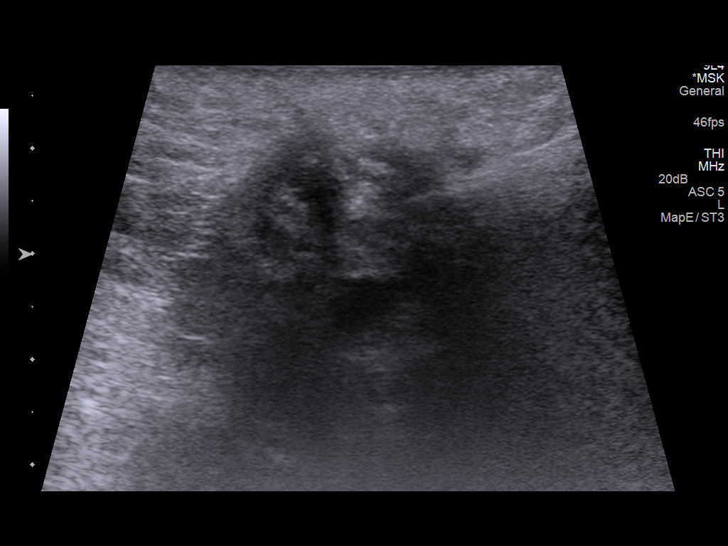
[im 5/11]
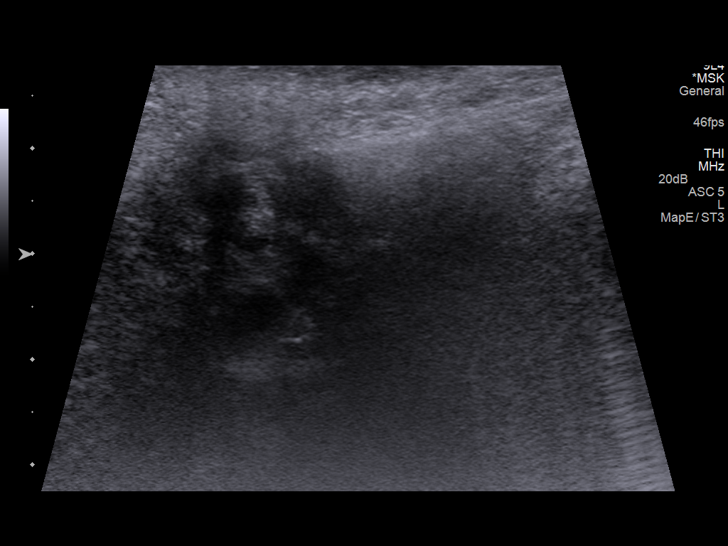
[im 6/11]
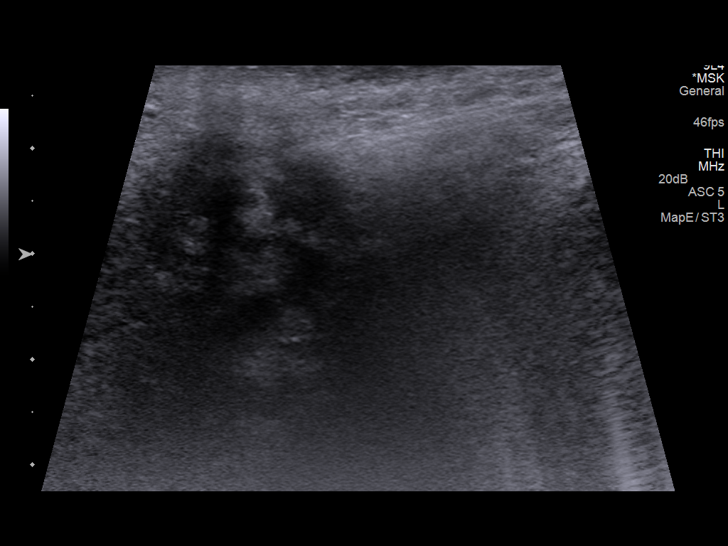
[im 7/11]
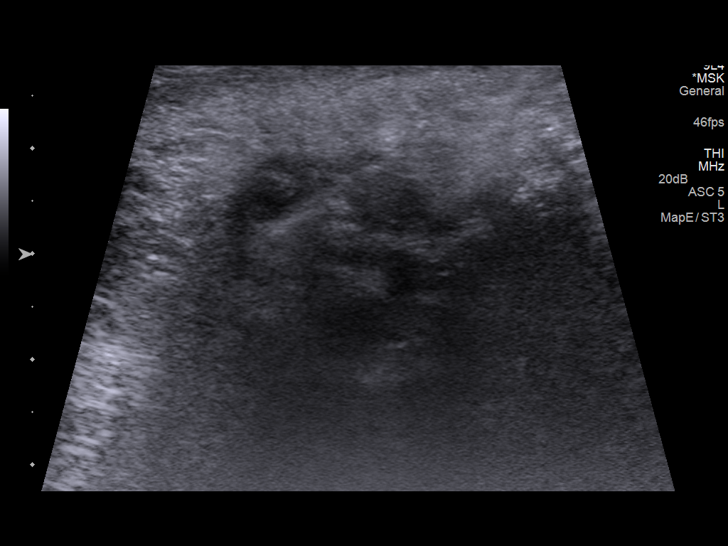
[im 8/11]
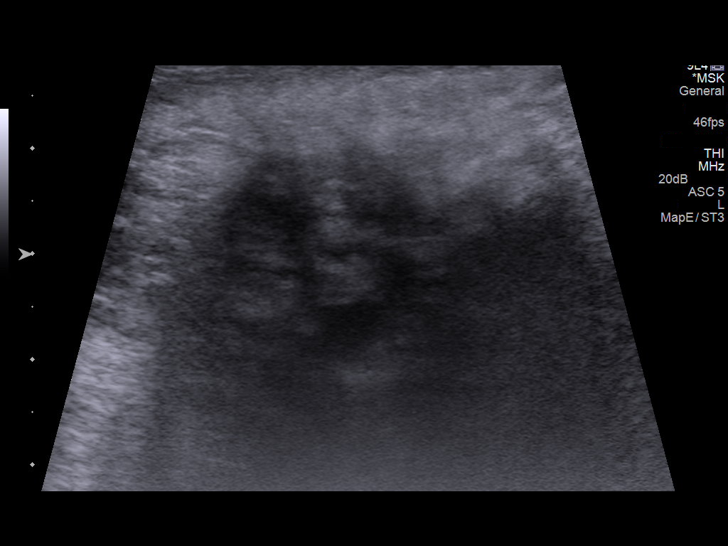
[im 9/11]
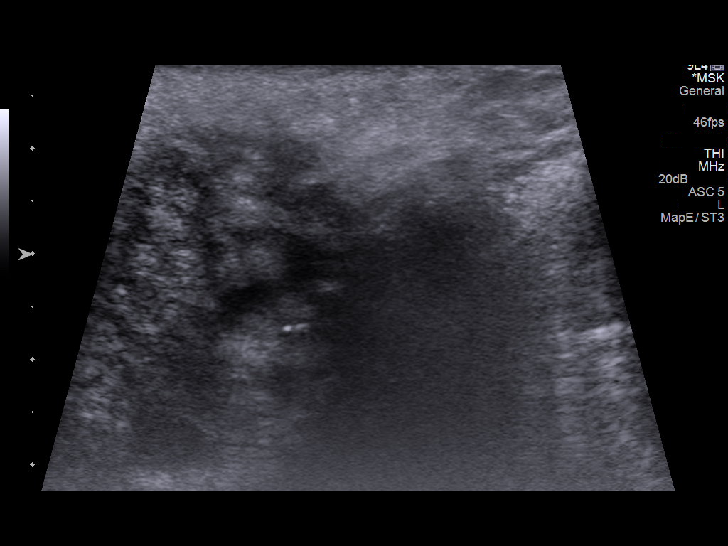
[im 10/11]
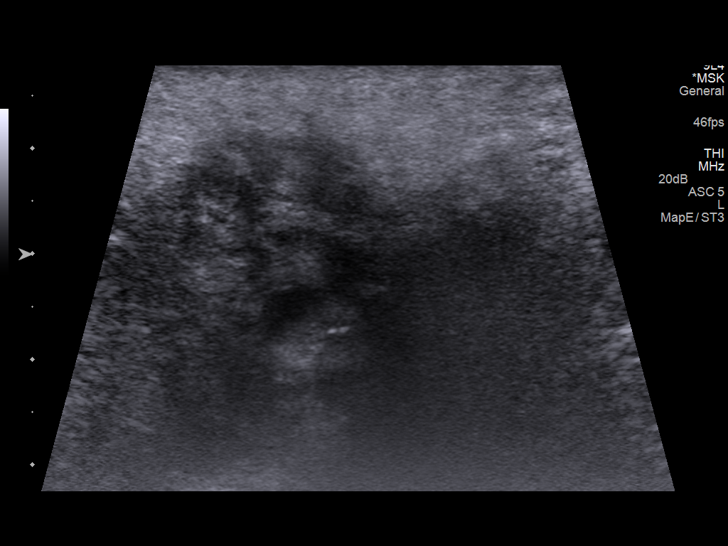
[im 11/11]
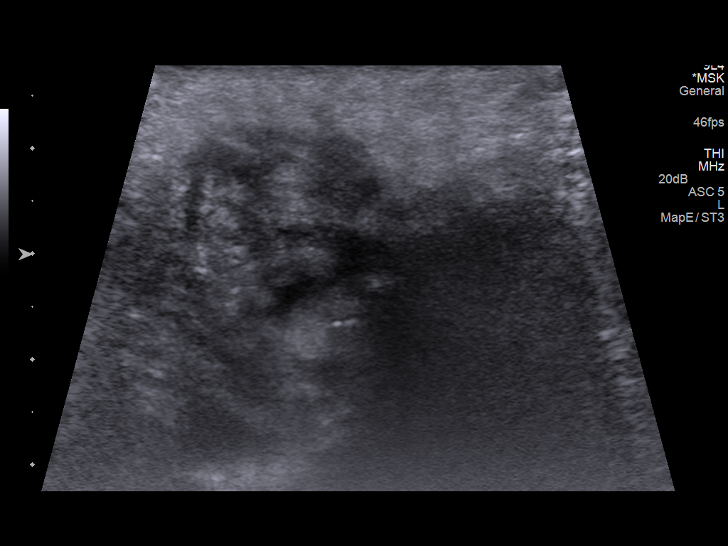

[11 of 11 positions shown; findings below may reference images not displayed]

The right axilla was interrogated with ultrasound. Ultrasound
evaluation is extremely difficult secondary to frozen positioning of
the shoulder. Despite general anesthesia, only limited axis the
axilla could be achieved. There is a heterogeneous dense soft tissue
mass surrounding the axillary artery. Internal compliments of the
mass demonstrates dense shadowing suggesting scarring/fibrosis.

A suitable window that would allow biopsy of the periphery of the
mass while maintaining safety of the axillary artery was
successfully identified. The overlying skin was marked. The region
was then sterilely prepped and draped using chlorhexidine skin prep.

A small dermatotomy was made. Under real-time sonographic guidance,
multiple 18 gauge core biopsies were obtained using the [employer]
automated biopsy device. Biopsies were directed into the margin of
the mass while maintaining visualization of the axillary artery.
Biopsy specimens were placed in formalin and delivered to pathology.

Hemostasis was attained with gentle manual pressure.

COMPLICATIONS:
None
IMPRESSION: Extremely challenging but ultimately technically successful biopsy
of ill-defined soft tissue mass surrounding the right axillary
neurovascular bundle.

## 2016-03-13 DEATH — deceased

## 2016-05-25 ENCOUNTER — Telehealth: Payer: Self-pay | Admitting: Hematology and Oncology

## 2016-05-25 NOTE — Telephone Encounter (Signed)
Faxed records to [facility] [phone] release id)
